# Patient Record
Sex: Female | Born: 1965 | Race: Black or African American | Hispanic: No | Marital: Married | Smoking: Never smoker
Health system: Southern US, Community
[De-identification: ages and names within clinical notes are randomized; demographics above are authoritative.]

## PROBLEM LIST (undated history)

## (undated) DIAGNOSIS — Z789 Other specified health status: Secondary | ICD-10-CM

## (undated) DIAGNOSIS — J069 Acute upper respiratory infection, unspecified: Secondary | ICD-10-CM

## (undated) DIAGNOSIS — T7840XA Allergy, unspecified, initial encounter: Secondary | ICD-10-CM

## (undated) HISTORY — PX: OTHER SURGICAL HISTORY: SHX169

## (undated) HISTORY — PX: ABDOMINAL HYSTERECTOMY: SHX81

## (undated) HISTORY — PX: WISDOM TOOTH EXTRACTION: SHX21

## (undated) HISTORY — DX: Allergy, unspecified, initial encounter: T78.40XA

---

## 1999-10-03 ENCOUNTER — Other Ambulatory Visit: Admission: RE | Admit: 1999-10-03 | Discharge: 1999-10-03 | Payer: Self-pay | Admitting: Obstetrics

## 2000-04-23 ENCOUNTER — Other Ambulatory Visit: Admission: RE | Admit: 2000-04-23 | Discharge: 2000-04-23 | Payer: Self-pay | Admitting: Obstetrics and Gynecology

## 2000-05-05 ENCOUNTER — Ambulatory Visit (HOSPITAL_COMMUNITY): Admission: RE | Admit: 2000-05-05 | Discharge: 2000-05-05 | Payer: Self-pay | Admitting: Obstetrics and Gynecology

## 2000-05-05 HISTORY — PX: TUBAL LIGATION: SHX77

## 2001-05-25 ENCOUNTER — Other Ambulatory Visit: Admission: RE | Admit: 2001-05-25 | Discharge: 2001-05-25 | Payer: Self-pay | Admitting: Obstetrics and Gynecology

## 2003-10-18 ENCOUNTER — Other Ambulatory Visit: Admission: RE | Admit: 2003-10-18 | Discharge: 2003-10-18 | Payer: Self-pay | Admitting: Obstetrics and Gynecology

## 2003-10-24 ENCOUNTER — Ambulatory Visit (HOSPITAL_COMMUNITY): Admission: RE | Admit: 2003-10-24 | Discharge: 2003-10-24 | Payer: Self-pay | Admitting: Obstetrics and Gynecology

## 2004-10-21 ENCOUNTER — Other Ambulatory Visit: Admission: RE | Admit: 2004-10-21 | Discharge: 2004-10-21 | Payer: Self-pay | Admitting: Obstetrics and Gynecology

## 2005-02-28 ENCOUNTER — Encounter (INDEPENDENT_AMBULATORY_CARE_PROVIDER_SITE_OTHER): Payer: Self-pay | Admitting: Specialist

## 2005-02-28 ENCOUNTER — Ambulatory Visit (HOSPITAL_COMMUNITY): Admission: RE | Admit: 2005-02-28 | Discharge: 2005-02-28 | Payer: Self-pay | Admitting: Obstetrics and Gynecology

## 2005-02-28 HISTORY — PX: DILATION AND CURETTAGE OF UTERUS: SHX78

## 2005-02-28 HISTORY — PX: NOVASURE ABLATION: SHX5394

## 2005-06-09 ENCOUNTER — Emergency Department (HOSPITAL_COMMUNITY): Admission: EM | Admit: 2005-06-09 | Discharge: 2005-06-09 | Payer: Self-pay | Admitting: Family Medicine

## 2005-06-17 ENCOUNTER — Emergency Department (HOSPITAL_COMMUNITY): Admission: EM | Admit: 2005-06-17 | Discharge: 2005-06-17 | Payer: Self-pay | Admitting: Emergency Medicine

## 2006-03-29 ENCOUNTER — Emergency Department (HOSPITAL_COMMUNITY): Admission: EM | Admit: 2006-03-29 | Discharge: 2006-03-29 | Payer: Self-pay | Admitting: Family Medicine

## 2010-01-07 ENCOUNTER — Other Ambulatory Visit: Admission: RE | Admit: 2010-01-07 | Discharge: 2010-01-07 | Payer: Self-pay | Admitting: Family Medicine

## 2010-08-09 NOTE — H&P (Signed)
[facility]  Patient:    Carol Giles, Carol Giles                    MRN: [id number] Adm. Date:  05/05/00 Attending:  Erie Noe P. Pennie Rushing, M.D.                         History and Physical  DATE OF BIRTH:                March 21, 1966  HISTORY OF PRESENT ILLNESS:   The patient is a 45 year old black female, para 2, 0, 0, 2, who presents for a surgical sterilization.  Her last menstrual period was approximately April 29, 2000.  Her periods are regular. Contraception is Norplant for the last 10 years.  (Had a new Norplant placed five years ago.)  PAST MENSTRUAL HISTORY:      Menarche at age 22, with monthly menses even with the Norplant in place.  The patient is currently sexually active.  GYN HISTORY:                  Negative.  PAST MEDICAL HISTORY:         Negative.  FAMILY HISTORY:               Positive for hypertension.  CURRENT MEDICATIONS:          None.  HABITS:                       Diet:  Regular.  Exercise:  Occasional.  ALLERGIES:                    PENICILLIN.  OBSTETRICAL HISTORY:          Two vaginal full-term deliveries.  REVIEW OF SYSTEMS:            Negative.  PHYSICAL EXAMINATION:  VITAL SIGNS:                  Blood pressure 120/80, weight 190 pounds, height 5 feet 2-3/4 inches.  HEENT:                        Within normal limits.  NECK/THYROID:                 Within normal limits.  CHEST:                        Within normal limits.  HEART:                        Within normal limits.  BREASTS:                      Within normal limits.  BACK:                         Within normal limits.  ABDOMEN:                      Within normal limits.  PELVIC:                       Within normal limits.  EXTREMITIES:                  Show six Norplant capsules in  the left forearm, without erythema, edema, or tenderness.  IMPRESSION:                   1. Desire for surgical sterilization.                               2.  Desires to have Norplant removed, as it                                  is past its due date.  DISPOSITION:                  A long discussion was held with the patient concerning options for management, and she wishes a surgical sterilization. She understands the risks of anesthesia, bleeding, infection, and damage to the adjacent organs, as well as the risks of failure of the tubal ligation with subsequent pregnancy.  She wishes to proceed.  She will have her Norplant removed at her postoperative visit.  Her tubal ligation will be performed at [facility] on May 05, 2000. DD:  05/04/00 TD:  05/05/00 Job: [id number]

## 2010-08-09 NOTE — Op Note (Signed)
NAMEHAZELGRACE, BONHAM            ACCOUNT NO.:  [id number]   MEDICAL RECORD NO.:  [id number]          PATIENT TYPE:  AMB   LOCATION:  SDC                           FACILITY:  [facility]   PHYSICIAN:  Janine Limbo, M.D.DATE OF BIRTH:  09/25/1965   DATE OF PROCEDURE:  02/28/2005  DATE OF DISCHARGE:                                 OPERATIVE REPORT   PREOPERATIVE DIAGNOSIS:  Menorrhagia.   POSTOPERATIVE DIAGNOSIS:  Menorrhagia.   PROCEDURE:  1.  Hysteroscopy.  2.  Dilatation and curettage.  3.  NovaSure endometrial ablation.   SURGEON:  Dr. Leonard Schwartz.   ANESTHETIC:  Was monitored anesthetic control and paracervical block.   DISPOSITION:  [name] is a 45 year old female who presents with  menorrhagia. She has not responded to hormonal therapy nor nonsteroidal anti-  inflammatory therapy. The patient understands the indications for her  surgical procedure and she accepts the risks of, but not limited to,  anesthetic complications, bleeding, infection, and possible damage to  surrounding organs.   FINDINGS:  The uterus was upper limits normal size. The endometrial cavity  sounded to 8.5 cm. The cervical length was 3.5 cm. No adnexal masses were  appreciated. On hysteroscopy we did not see any pathologic lesions within  the endometrial cavity. Pictures were taken.   PROCEDURE:  The patient was taken to the operating room where she was given  medication through her IV line. The patient's perineum, and vagina were  prepped with multiple layers of Betadine. A Foley catheter was placed in the  bladder. The patient was sterilely draped. Examination under anesthesia was  performed. A paracervical block was placed using 10 mL of half percent  Marcaine with epinephrine. An additional 10 mL of half percent Marcaine with  epinephrine were injected directly into the cervix. An endocervical  curettage was obtained. The uterus sounded to 8.5 cm. The cervix was  dilated. The  diagnostic hysteroscope was inserted and the cavity was  carefully inspected. No pathologic lesions were noted. Pictures were taken.  The hysteroscope was removed and the cavity was then curetted using a medium  sharp curette. The cavity was thought to be clean at the end of our  procedure. We then inserted the NovaSure ablation instrument. The cavity  length was noted to be 5.0 cm. The cavity width was noted to be 4.2 cm. The  power setting for device was placed at 116. A test was performed and the  cavity was noted to be intact. We then began the ablation procedure. The  total ablation time was 1 minute 40 seconds. The NovaSure device was removed  and the diagnostic hysteroscope was again inserted. The cavity was carefully  inspected. The cavity was noted to be adequately ablated. Once again a  picture was taken. All instruments were removed. Examination under  anesthesia was repeated and the uterus was noted to be firm. Hemostasis was  adequate. There was a fluid deficit of 110 mL of lactated Ringer's but it  should be noted that lactated Ringer's was noted on the operative drapes and  on the operative floor. Estimated blood loss  for the procedure was 10 mL.  The patient tolerated her procedure well. She was awakened from her  anesthetic without difficulty and taken to the recovery room in stable  condition. She tolerated her procedure well. The Foley catheter was removed.   FOLLOW-UP INSTRUCTIONS:  The patient was given a prescription for Vicodin  and she will take 1 or 2 tablets every 4 hours as needed for pain. She will  return to see Dr. Pennie Rushing in 2-3 weeks for follow-up examination. She will  call sooner for questions or concerns. She was given a copy of the  postoperative instruction sheet as prepared by the [facility] for patients who have undergone a dilatation and curettage.      Janine Limbo, M.D.  Electronically Signed     AVS/MEDQ  D:   02/28/2005  T:  02/28/2005  Job:  [id number]

## 2010-08-09 NOTE — Op Note (Signed)
[facility]  Patient:    Carol Giles, Carol Giles                   MRN: [id number] Proc. Date: 05/05/00 Adm. Date:  [id number] Attending:  Dierdre Forth Pearline                           Operative Report  PREOPERATIVE DIAGNOSIS:       Desire for surgical sterilization.  POSTOPERATIVE DIAGNOSES:      Desire for surgical sterilization and pelvic adhesions.  OPERATION:                    Laparoscopic tubal cautery.  SURGEON:                      Vanessa P. Pennie Rushing, M.D.  ANESTHESIA:                   General orotracheal.  ESTIMATED BLOOD LOSS:         Less than 10 cc.  COMPLICATIONS:                None.  FINDINGS:                     The uterus was essentially normal size. There was a veil of omental adhesions adherent to the bladder flap reflection and overlying the uterus. The tubes on the right side and left side had filmy adhesions which were primarily along the mid tube to the posterior uterus. The ovaries could not be well visualized secondary to adhesions.  DESCRIPTION OF PROCEDURE:     The patient was taken to the operating room after appropriate identification and placed on the operating table. After the attainment of adequate general anesthesia, she was placed in the modified lithotomy position. The abdomen, perineum, and vagina were prepped with multiple layers of Betadine and a red Robinson catheter used to empty the bladder. A single-tooth tenaculum was placed on the anterior cervix. The abdomen was draped as a sterile field. Five cc of 0.25% Marcaine was used to infiltrate the subumbilical area. A subumbilical incision was made and the Veress cannula placed through that incision into the peritoneal cavity. A pneumoperitoneum was created with 3 liters of CO2 and the Veress cannula removed. The laparoscopic trocar was placed through the incision into the peritoneal cavity. The laparoscope was placed through the trocar sleeve.  The cautery mechanism was placed through the operating channel of the laparoscope and the right fallopian tube identified, followed to its fimbriated end, the grasped at the isthmic portion and cauterized into adjacent areas. On the left side, the left fallopian tube was followed to its fimbriated end, grasped at the isthmic portion, and cauterized. An area slightly distal to the previous two cautery sites bled slightly on the tube and cautery was used to control that bleeding. Hemostasis was noted to be adequate. All instruments were removed from the peritoneal cavity under direct visualization and the CO2 was allowed to escape. The subumbilical incision was closed with a subcuticular suture of 3-0 Vicryl. A sterile dressing was applied. The single-tooth tenaculum was removed and the patient awakened from general anesthesia and taken to the recovery room in satisfactory condition having tolerated the procedure well with sponge and instrument counts correct.  DISCHARGE MEDICATIONS:        1. Ibuprofen 600 mg p.o. q.6h. p.r.n. pain.  2. Vicodin one p.o. q.6h. p.r.n. pain not                                  relieved by Ibuprofen.                               3. Doxycycline 100 mg p.o. b.i.d. for seven                                  days. DD:  05/05/00 TD:  05/05/00 Job: [id number]

## 2010-08-09 NOTE — H&P (Signed)
NAME:  Carol Giles, BARGA            ACCOUNT NO.:  [id number]   MEDICAL RECORD NO.:  [id number]          PATIENT TYPE:  AMB   LOCATION:  SDC                           FACILITY:  [facility]   PHYSICIAN:  Janine Limbo, M.D.DATE OF BIRTH:  May 31, 1965   DATE OF ADMISSION:  DATE OF DISCHARGE:                                HISTORY & PHYSICAL   HISTORY OF PRESENT ILLNESS:  Ms. Carol Giles is a 45 year old married African-  American female para 2-0-0-2 who is status post laparoscopic tubal cautery  and presents for endometrial ablation (NovaSure) procedure because of heavy  vaginal bleeding.  For the past several months the patient has had a five  day menstrual flow in which she changes a pad every 1 to 1.5 hours.  In  addition, the patient has on occasion soiled her clothing. She denies any  cramping, any vaginitis, any fever or dyspareunia.  The patient's pelvic  ultrasound in November 2006 was within normal limits, not showing any  ovarian cyst or uterine fibroids.  The patient's past thyroid panel has also  proven to be within normal limits.  The patient was given the option of  managing her symptoms with hormonal therapy, endometrial ablation, Mirena  IUD, hysterectomy, however, she has chosen to proceed with endometrial  ablation.   OBSTETRICAL HISTORY:  Gravida 2, para 2-0-0-2. GYN history menarche 45 years  old.  Patient's last menstrual period was February 21, 2005. She uses  laparoscopic tubal cautery as her method of contraception. She denies any  history of abnormal Pap smears or sexually transmitted diseases.   PAST MEDICAL HISTORY:  Negative.   PAST SURGICAL HISTORY:  2002 laparoscopic tubal cautery.  Denies any  problems with anesthesia or history of blood transfusions.   FAMILY HISTORY:  Positive for hypertension, thyroid disease, non-insulin-  dependent diabetes mellitus and stroke.   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   HABITS:  She does  not use alcohol or tobacco.   CURRENT MEDICATIONS:  None.   ALLERGIES:  PENICILLIN which causes hives.   REVIEW OF SYSTEMS:  The patient does wear contact lenses and except as  mentioned in  history of present illness, her review of systems is negative.   PHYSICAL EXAMINATION:  VITAL SIGNS:  Blood pressure 130/80. Height 5 feet 2-  1/2 inches tall.  GENERAL, NECK: Supple.  There are no masses, adenopathy or thyromegaly.  HEART:  Regular rate and rhythm.  There is no murmur.  LUNGS:  Clear to auscultation.  There are no wheezes, rales or rhonchi.  BACK:  No costovertebral angle tenderness.  ABDOMEN:  Bowel sounds present.  It is soft without tenderness, guarding,  rebound or organomegaly.  EXTREMITIES:  Are without cyanosis, clubbing or edema.  PELVIC:  EG/BUS are within normal limits.  Vagina is rugose. Cervix is  nontender without lesions. Uterus appears upper limits of normal size,  however, has normal shape and consistency and without tenderness.  Adnexa  have no tenderness or masses. Rectovaginal examination without tenderness or  masses.   IMPRESSION:  Menorrhagia.   DISPOSITION:  A  discussion was held with patient regarding the indications  for her procedure along with its' risks which include, but are not limited  to, reaction to anesthesia, damage to adjacent organs, infection and  excessive bleeding.  The patient was given a copy of the brochure on  NovaSure endometrial ablation.  She has consented to proceed with this  procedure at [facility] [place] on Friday, February 28, 2005 at  10:00 A.M.      [name].      Janine Limbo, M.D.  Electronically Signed    EJP/MEDQ  D:  02/25/2005  T:  02/25/2005  Job:  [id number]

## 2010-10-05 ENCOUNTER — Inpatient Hospital Stay (INDEPENDENT_AMBULATORY_CARE_PROVIDER_SITE_OTHER)
Admission: RE | Admit: 2010-10-05 | Discharge: 2010-10-05 | Disposition: A | Discharge status: Home / Self Care | Payer: 59 | Source: Ambulatory Visit | Attending: Family Medicine | Admitting: Family Medicine

## 2010-10-05 DIAGNOSIS — L03019 Cellulitis of unspecified finger: Secondary | ICD-10-CM

## 2011-02-05 ENCOUNTER — Other Ambulatory Visit: Payer: Self-pay | Admitting: Obstetrics & Gynecology

## 2011-02-15 ENCOUNTER — Encounter (HOSPITAL_COMMUNITY): Payer: Self-pay

## 2011-02-20 NOTE — Patient Instructions (Addendum)
   Your procedure is scheduled on: Monday, Dec 10th  Enter through the [employer] [facility] at: 6:00am Pick up the phone at the desk and dial [phone] and inform us of your arrival.  Please call this number if you have any problems the morning of surgery: [phone]  Remember: Do not eat food after midnight: Sunday Do not drink clear liquids after: Sunday Take these medicines the morning of surgery with a SIP OF WATER: None  Do not wear jewelry, make-up, or FINGER nail polish Do not wear lotions, powders, perfumes or deodorant. Do not shave 48 hours prior to surgery. Do not bring valuables to the hospital.  Patients discharged on the day of surgery will not be allowed to drive home.    Remember to use your hibiclens as instructed.Please shower with 1/2 bottle the evening before your surgery and the other 1/2 bottle the morning of surgery.

## 2011-02-21 ENCOUNTER — Encounter (HOSPITAL_COMMUNITY): Payer: Self-pay

## 2011-02-21 ENCOUNTER — Encounter (HOSPITAL_COMMUNITY)
Admission: RE | Admit: 2011-02-21 | Discharge: 2011-02-21 | Disposition: A | Discharge status: Home / Self Care | Payer: 59 | Source: Ambulatory Visit | Attending: Obstetrics & Gynecology | Admitting: Obstetrics & Gynecology

## 2011-02-21 HISTORY — DX: Acute upper respiratory infection, unspecified: J06.9

## 2011-02-21 LAB — CBC
MCV: 91.7 fL (ref 78.0–100.0)
Platelets: 258 10*3/uL (ref 150–400)
RBC: 4.11 MIL/uL (ref 3.87–5.11)
WBC: 4.2 10*3/uL (ref 4.0–10.5)

## 2011-02-21 NOTE — Pre-Procedure Instructions (Signed)
Ok to see patient DOS. 

## 2011-02-25 ENCOUNTER — Encounter (HOSPITAL_COMMUNITY): Payer: Self-pay | Admitting: Obstetrics & Gynecology

## 2011-02-25 DIAGNOSIS — N393 Stress incontinence (female) (male): Secondary | ICD-10-CM | POA: Diagnosis present

## 2011-02-25 NOTE — H&P (Signed)
  Chief Complaint: 45 y.o. who presents with stress urinary incontinence  Details of Present Illness: Long h/o urine with physical activity, cough or sneeze.  Exam in the office with a positive stress test.  Voiding diary reviewed.  There were no vitals taken for this visit.  Past Medical History  Diagnosis Date  . Recurrent upper respiratory infection (URI)     abx completed   History   Social History  . Marital Status: Married    Spouse Name: N/A    Number of Children: N/A  . Years of Education: N/A   Occupational History  . Not on file.   Social History Main Topics  . Smoking status: Never Smoker   . Smokeless tobacco: Never Used  . Alcohol Use: Yes     socially  . Drug Use: No  . Sexually Active: Yes    Birth Control/ Protection: Surgical   Other Topics Concern  . Not on file   Social History Narrative  . No narrative on file   History reviewed. No pertinent family history.  Pertinent items are noted in HPI.  Pre-Op Diagnosis: STRESS URINARY INCONTINENCE   Planned Procedure: Procedure(s): TRANSVAGINAL TAPE (TVT) PROCEDURE  I have reviewed the patient's history and have completed the physical exam and Carol Giles is acceptable for surgery.  Roseanna Rainbow, MD 02/25/2011 12:43 PM

## 2011-03-03 ENCOUNTER — Encounter (HOSPITAL_COMMUNITY): Payer: Self-pay | Admitting: Obstetrics & Gynecology

## 2011-03-03 ENCOUNTER — Ambulatory Visit (HOSPITAL_COMMUNITY): Payer: 59 | Admitting: Anesthesiology

## 2011-03-03 ENCOUNTER — Encounter (HOSPITAL_COMMUNITY)
Admission: RE | Disposition: A | Discharge status: Home / Self Care | Payer: Self-pay | Source: Ambulatory Visit | Attending: Obstetrics & Gynecology

## 2011-03-03 ENCOUNTER — Encounter (HOSPITAL_COMMUNITY): Payer: Self-pay | Admitting: Anesthesiology

## 2011-03-03 ENCOUNTER — Ambulatory Visit (HOSPITAL_COMMUNITY)
Admission: RE | Admit: 2011-03-03 | Discharge: 2011-03-03 | Disposition: A | Discharge status: Home / Self Care | Payer: 59 | Source: Ambulatory Visit | Attending: Obstetrics & Gynecology | Admitting: Obstetrics & Gynecology

## 2011-03-03 DIAGNOSIS — Z01812 Encounter for preprocedural laboratory examination: Secondary | ICD-10-CM | POA: Insufficient documentation

## 2011-03-03 DIAGNOSIS — N393 Stress incontinence (female) (male): Secondary | ICD-10-CM | POA: Diagnosis present

## 2011-03-03 DIAGNOSIS — Z01818 Encounter for other preprocedural examination: Secondary | ICD-10-CM | POA: Insufficient documentation

## 2011-03-03 HISTORY — PX: BLADDER SUSPENSION: SHX72

## 2011-03-03 HISTORY — PX: CYSTOSCOPY: SHX5120

## 2011-03-03 SURGERY — URETHROPEXY, USING TRANSVAGINAL TAPE
Anesthesia: General | Site: Vagina | Laterality: Bilateral | Wound class: Clean Contaminated

## 2011-03-03 MED ORDER — NEOSTIGMINE METHYLSULFATE 1 MG/ML IJ SOLN
INTRAMUSCULAR | Status: AC
  Filled 2011-03-03: qty 10

## 2011-03-03 MED ORDER — ACETAMINOPHEN 325 MG PO TABS: 325.0000 mg | ORAL_TABLET | ORAL | Status: DC | PRN

## 2011-03-03 MED ORDER — MIDAZOLAM HCL 5 MG/5ML IJ SOLN
INTRAMUSCULAR | Status: DC | PRN
  Administered 2011-03-03: 2 mg via INTRAVENOUS

## 2011-03-03 MED ORDER — PROMETHAZINE HCL 25 MG/ML IJ SOLN: 6.2500 mg | INTRAMUSCULAR | Status: DC | PRN

## 2011-03-03 MED ORDER — FENTANYL CITRATE 0.05 MG/ML IJ SOLN
INTRAMUSCULAR | Status: AC
  Filled 2011-03-03: qty 2

## 2011-03-03 MED ORDER — KETOROLAC TROMETHAMINE 30 MG/ML IJ SOLN
INTRAMUSCULAR | Status: DC | PRN
  Administered 2011-03-03: 30 mg via INTRAVENOUS

## 2011-03-03 MED ORDER — NEOSTIGMINE METHYLSULFATE 1 MG/ML IJ SOLN
INTRAMUSCULAR | Status: DC | PRN
  Administered 2011-03-03: 3 mg via INTRAVENOUS

## 2011-03-03 MED ORDER — FENTANYL CITRATE 0.05 MG/ML IJ SOLN: 25.0000 ug | INTRAMUSCULAR | Status: DC | PRN

## 2011-03-03 MED ORDER — MIDAZOLAM HCL 2 MG/2ML IJ SOLN
INTRAMUSCULAR | Status: AC
  Filled 2011-03-03: qty 2

## 2011-03-03 MED ORDER — ROCURONIUM BROMIDE 100 MG/10ML IV SOLN
INTRAVENOUS | Status: DC | PRN
  Administered 2011-03-03: 50 mg via INTRAVENOUS

## 2011-03-03 MED ORDER — KETOROLAC TROMETHAMINE 30 MG/ML IJ SOLN
INTRAMUSCULAR | Status: AC
  Filled 2011-03-03: qty 1

## 2011-03-03 MED ORDER — DEXAMETHASONE SODIUM PHOSPHATE 10 MG/ML IJ SOLN
INTRAMUSCULAR | Status: DC | PRN
  Administered 2011-03-03: 10 mg via INTRAVENOUS

## 2011-03-03 MED ORDER — LIDOCAINE-EPINEPHRINE 0.5-1:200000 % IJ SOLN
INTRAMUSCULAR | Status: DC | PRN
  Administered 2011-03-03: 7 mL

## 2011-03-03 MED ORDER — FENTANYL CITRATE 0.05 MG/ML IJ SOLN
INTRAMUSCULAR | Status: DC | PRN
  Administered 2011-03-03 (×3): 50 ug via INTRAVENOUS

## 2011-03-03 MED ORDER — STERILE WATER FOR IRRIGATION IR SOLN
Status: DC | PRN
  Administered 2011-03-03: 1000 mL via INTRAVESICAL

## 2011-03-03 MED ORDER — LIDOCAINE HCL (CARDIAC) 20 MG/ML IV SOLN
INTRAVENOUS | Status: DC | PRN
  Administered 2011-03-03: 100 mg via INTRAVENOUS

## 2011-03-03 MED ORDER — KETOROLAC TROMETHAMINE 30 MG/ML IJ SOLN: 15.0000 mg | Freq: Once | INTRAMUSCULAR | Status: DC | PRN

## 2011-03-03 MED ORDER — DEXAMETHASONE SODIUM PHOSPHATE 10 MG/ML IJ SOLN
INTRAMUSCULAR | Status: AC
  Filled 2011-03-03: qty 1

## 2011-03-03 MED ORDER — CLINDAMYCIN PHOSPHATE 900 MG/50ML IV SOLN
900.0000 mg | Freq: Once | INTRAVENOUS | Status: AC
  Administered 2011-03-03: 900 mg via INTRAVENOUS
  Filled 2011-03-03: qty 50

## 2011-03-03 MED ORDER — OXYCODONE-ACETAMINOPHEN 5-325 MG PO TABS: 2.0000 | ORAL_TABLET | Freq: Four times a day (QID) | ORAL | Status: AC | PRN

## 2011-03-03 MED ORDER — GLYCOPYRROLATE 0.2 MG/ML IJ SOLN
INTRAMUSCULAR | Status: AC
  Filled 2011-03-03: qty 1

## 2011-03-03 MED ORDER — LACTATED RINGERS IV SOLN
INTRAVENOUS | Status: DC
  Administered 2011-03-03: 1000 mL via INTRAVENOUS
  Administered 2011-03-03: 08:00:00 via INTRAVENOUS

## 2011-03-03 MED ORDER — GLYCOPYRROLATE 0.2 MG/ML IJ SOLN
INTRAMUSCULAR | Status: DC | PRN
  Administered 2011-03-03: 0.2 mg via INTRAVENOUS
  Administered 2011-03-03: .4 mg via INTRAVENOUS

## 2011-03-03 MED ORDER — ONDANSETRON HCL 4 MG/2ML IJ SOLN
INTRAMUSCULAR | Status: DC | PRN
  Administered 2011-03-03: 4 mg via INTRAVENOUS

## 2011-03-03 MED ORDER — LIDOCAINE HCL (CARDIAC) 20 MG/ML IV SOLN
INTRAVENOUS | Status: AC
  Filled 2011-03-03: qty 5

## 2011-03-03 MED ORDER — ROCURONIUM BROMIDE 50 MG/5ML IV SOLN
INTRAVENOUS | Status: AC
  Filled 2011-03-03: qty 1

## 2011-03-03 MED ORDER — LEVOFLOXACIN 500 MG PO TABS: 500.0000 mg | ORAL_TABLET | Freq: Every day | ORAL | Status: AC

## 2011-03-03 MED ORDER — ONDANSETRON HCL 4 MG/2ML IJ SOLN
INTRAMUSCULAR | Status: AC
  Filled 2011-03-03: qty 2

## 2011-03-03 MED ORDER — PROPOFOL 10 MG/ML IV EMUL
INTRAVENOUS | Status: DC | PRN
  Administered 2011-03-03: 200 mg via INTRAVENOUS

## 2011-03-03 SURGICAL SUPPLY — 26 items
ADH SKN CLS APL DERMABOND .7 (GAUZE/BANDAGES/DRESSINGS) ×2
BLADE SURG 15 STRL LF C SS BP (BLADE) ×2 IMPLANT
BLADE SURG 15 STRL SS (BLADE) ×3
BLADE SURG CLIPPER 3M 9600 (MISCELLANEOUS) ×1 IMPLANT
CANISTER SUCTION 2500CC (MISCELLANEOUS) ×3 IMPLANT
CATH ROBINSON RED A/P 16FR (CATHETERS) IMPLANT
CLOTH BEACON ORANGE TIMEOUT ST (SAFETY) ×3 IMPLANT
DECANTER SPIKE VIAL GLASS SM (MISCELLANEOUS) ×1 IMPLANT
DERMABOND ADVANCED (GAUZE/BANDAGES/DRESSINGS) ×1
DERMABOND ADVANCED .7 DNX12 (GAUZE/BANDAGES/DRESSINGS) IMPLANT
DRAPE HYSTEROSCOPY (DRAPE) ×3 IMPLANT
GAUZE PACKING 2X5 YD STERILE (GAUZE/BANDAGES/DRESSINGS) IMPLANT
GLOVE BIO SURGEON STRL SZ 6.5 (GLOVE) ×6 IMPLANT
GOWN PREVENTION PLUS LG XLONG (DISPOSABLE) ×6 IMPLANT
NEEDLE HYPO 22GX1.5 SAFETY (NEEDLE) ×3 IMPLANT
NS IRRIG 1000ML POUR BTL (IV SOLUTION) ×3 IMPLANT
PACK VAGINAL WOMENS (CUSTOM PROCEDURE TRAY) ×3 IMPLANT
PEN SKIN MARKING STD PT W/RULR (MISCELLANEOUS) ×1 IMPLANT
SET CYSTO W/LG BORE CLAMP LF (SET/KITS/TRAYS/PACK) ×3 IMPLANT
SUT CHROMIC 2 0 SH (SUTURE) ×1 IMPLANT
SUT VIC AB 2-0 SH 27 (SUTURE) ×3
SUT VIC AB 2-0 SH 27XBRD (SUTURE) IMPLANT
TOWEL OR 17X24 6PK STRL BLUE (TOWEL DISPOSABLE) ×6 IMPLANT
TRAY FOLEY CATH 14FR (SET/KITS/TRAYS/PACK) ×3 IMPLANT
TVT Abbrevio ×1 IMPLANT
WATER STERILE IRR 1000ML POUR (IV SOLUTION) ×3 IMPLANT

## 2011-03-03 NOTE — Interval H&P Note (Signed)
History and Physical Interval Note:  03/03/2011 7:19 AM  Carol Giles  has presented today for surgery, with the diagnosis of STRESS URINARY INCONTINENCE  The various methods of treatment have been discussed with the patient and family. After consideration of risks, benefits and other options for treatment, the patient has consented to  Procedure(s): TRANSVAGINAL TAPE (TVT) PROCEDURE as a surgical intervention .  The patients' history has been reviewed, patient examined, no change in status, stable for surgery.  I have reviewed the patients' chart and labs.  Questions were answered to the patient's satisfaction.     Arenson-MOORE,Carroll Lingelbach A

## 2011-03-03 NOTE — OR Nursing (Signed)
TVT Abbrevio implanted@ midurethral position in OR suite per Dr. Tamela Oddi. Sales Rep and Urology MD proctoring. Lot # [id number]. Exp. 09-22-11.

## 2011-03-03 NOTE — Brief Op Note (Signed)
03/03/2011  8:30 AM  PATIENT:  Carol Giles  46 y.o. female  PRE-OPERATIVE DIAGNOSIS:  STRESS URINARY INCONTINENCE  POST-OPERATIVE DIAGNOSIS:  Stress Urinary Incontinence  PROCEDURE:  Procedure(s): TRANSVAGINAL TAPE (TVT) PROCEDURE CYSTOSCOPY  SURGEON:  Surgeon(s): Roseanna Rainbow, MD Brock Bad, MD    ANESTHESIA:   general  EBL:  Total I/O In: 1000 [I.V.:1000] Out: -   BLOOD ADMINISTERED:none  DRAINS: none   LOCAL MEDICATIONS USED:  LIDOCAINE 10 CC COUNTS:  YES   PLAN OF CARE: Discharge to home after PACU  PATIENT DISPOSITION:  PACU - hemodynamically stable.   Delay start of Pharmacological VTE agent (>24hrs) due to surgical blood loss or risk of bleeding:  {YES/NO/NOT APPLICABLE:20182

## 2011-03-03 NOTE — Anesthesia Preprocedure Evaluation (Signed)
Anesthesia Evaluation  Patient identified by MRN, date of birth, ID band Patient awake    Reviewed: Allergy & Precautions, H&P , Patient's Chart, lab work & pertinent test results, reviewed documented beta blocker date and time   History of Anesthesia Complications Negative for: history of anesthetic complications  Airway Mallampati: II TM Distance: >3 FB Neck ROM: full    Dental No notable dental hx.    Pulmonary neg pulmonary ROS, Recent URI , Resolved,  clear to auscultation  Pulmonary exam normal       Cardiovascular Exercise Tolerance: Good neg cardio ROS regular Normal    Neuro/Psych Negative Neurological ROS  Negative Psych ROS   GI/Hepatic negative GI ROS, Neg liver ROS,   Endo/Other  Negative Endocrine ROS  Renal/GU negative Renal ROS     Musculoskeletal   Abdominal   Peds  Hematology negative hematology ROS (+)   Anesthesia Other Findings   Reproductive/Obstetrics negative OB ROS                           Anesthesia Physical Anesthesia Plan  ASA: I  Anesthesia Plan: General LMA   Post-op Pain Management:    Induction:   Airway Management Planned:   Additional Equipment:   Intra-op Plan:   Post-operative Plan:   Informed Consent: I have reviewed the patients History and Physical, chart, labs and discussed the procedure including the risks, benefits and alternatives for the proposed anesthesia with the patient or authorized representative who has indicated his/her understanding and acceptance.   Dental Advisory Given  Plan Discussed with: CRNA and Surgeon  Anesthesia Plan Comments:        please clear LMA as airway with surgeon Anesthesia Quick Evaluation

## 2011-03-03 NOTE — Transfer of Care (Signed)
Immediate Anesthesia Transfer of Care Note  Patient: Carol Giles  Procedure(s) Performed:  TRANSVAGINAL TAPE (TVT) PROCEDURE - transobturator mid-urethral sling placement ; CYSTOSCOPY  Patient Location: PACU  Anesthesia Type: General  Level of Consciousness: awake and alert   Airway & Oxygen Therapy: Patient Spontanous Breathing and Patient connected to nasal cannula oxygen  Post-op Assessment: Report given to PACU RN  Post vital signs: Reviewed and stable  Complications: No apparent anesthesia complications

## 2011-03-03 NOTE — Anesthesia Procedure Notes (Signed)
Procedure Name: Intubation Date/Time: 03/03/2011 7:44 AM Performed by: Trea Carnegie MARIE Pre-anesthesia Checklist: Patient identified, Patient being monitored, Emergency Drugs available, Timeout performed and Suction available Patient Re-evaluated:Patient Re-evaluated prior to inductionOxygen Delivery Method: Circle System Utilized Preoxygenation: Pre-oxygenation with 100% oxygen Intubation Type: IV induction Ventilation: Mask ventilation without difficulty Laryngoscope Size: Mac and 3 Grade View: Grade I Number of attempts: 2 Airway Equipment and Method: video-laryngoscopy

## 2011-03-03 NOTE — Op Note (Signed)
Placement of Gynecare TVT Anhar Mcdermott female 45 y.o. 03/03/2011  Procedure(s) and Anesthesia Type:    * TRANSVAGINAL TAPE (TVT) PROCEDURE - General    * CYSTOSCOPY - General  Surgeon(s) and Role:    * Roseanna Rainbow, MD - Primary    * Brock Bad, MD - Assisting   Indications: The patient had symptoms and findings consistent with uncomplicated genuine stress urinary incontinence.      Surgeon: Antionette Char A   Anesthesia: General endotracheal anesthesia   Procedure Detail  After a time-out had been completed, an 18 french Foley catheter was placed.  The bladder was emptied and the catheter clamped.  The areas for the groin incisions were located inferior to the adductus longus muscle insertions and marked.  The vaginal mucosa was infiltrated with 1%lidocaine with epinephrine.  A 1 - 2 cm, full thickness incision was made under the urethra.  The vaginal epithelium was dissected free to the pubic ramus.  This was performed bilaterally.  Starting on the patient's right side, the winged guide was placed in the dissected space.  The helical passer was placed over the winged guide.  The obturator membrane was perforated.  The helical passer was passed toward the marked groin incision.  The procedure was repeated on the opposite side.   The position lines were adjusted by placing a right angle instrument between the mesh and the urethra.  The mesh was released.  The placement loop was cut.  The vaginal incision was closed with a running suture of 2- 0 vicryl.  Dermabond was applied to the groin incisions.  The bladder was emptied and clear urine was noted.  The Foley catheter was removed.  A cystoscopy was performed.  The ureteral orifices were visualized.   At the close of the procedure, the instrument and pack counts were correct x 2.  The patient was awakened from anesthesia and taken to the PACU in stable condition  TRANSVAGINAL TAPE (TVT) PROCEDURE,  CYSTOSCOPY  Findings: See above  Estimated Blood Loss:  Minimal               Total IV Fluids: per Anesthesiology          Specimens: N/A         Implants: see above        Complications:  None         Disposition: PACU - hemodynamically stable.         Condition: stable

## 2011-03-03 NOTE — Transfer of Care (Signed)
Immediate Anesthesia Transfer of Care Note  Patient: Carol Giles  Procedure(s) Performed:  TRANSVAGINAL TAPE (TVT) PROCEDURE - transobturator mid-urethral sling placement ; CYSTOSCOPY  Patient Location: PACU  Anesthesia Type: General  Level of Consciousness: awake and sedated  Airway & Oxygen Therapy: Patient Spontanous Breathing  Post-op Assessment: Report given to PACU RN  Post vital signs: Reviewed and stable  Complications: No apparent anesthesia complications

## 2011-03-03 NOTE — Transfer of Care (Signed)
Immediate Anesthesia Transfer of Care Note  Patient: Carol Giles  Procedure(s) Performed:  TRANSVAGINAL TAPE (TVT) PROCEDURE - transobturator mid-urethral sling placement ; CYSTOSCOPY  Patient Location: PACU  Anesthesia Type: General  Level of Consciousness: awake, alert  and oriented  Airway & Oxygen Therapy: Patient Spontanous Breathing and Patient connected to nasal cannula oxygen  Post-op Assessment: Report given to PACU RN  Post vital signs: Reviewed and stable  Complications: No apparent anesthesia complications

## 2011-03-05 ENCOUNTER — Encounter (HOSPITAL_COMMUNITY): Payer: Self-pay | Admitting: Obstetrics & Gynecology

## 2011-03-05 NOTE — Anesthesia Postprocedure Evaluation (Signed)
  Anesthesia Post-op Note  Patient: [name]  Procedure(s) Performed:  TRANSVAGINAL TAPE (TVT) PROCEDURE - transobturator mid-urethral sling placement ; CYSTOSCOPY  Patient is awake and responsive. Pain and nausea are reasonably well controlled. Vital signs are stable and clinically acceptable. Oxygen saturation is clinically acceptable. There are no apparent anesthetic complications at this time. Patient is ready for discharge.

## 2011-09-05 ENCOUNTER — Other Ambulatory Visit: Payer: Self-pay | Admitting: Obstetrics & Gynecology

## 2011-09-05 DIAGNOSIS — Z1231 Encounter for screening mammogram for malignant neoplasm of breast: Secondary | ICD-10-CM

## 2011-09-10 ENCOUNTER — Other Ambulatory Visit: Payer: Self-pay | Admitting: Obstetrics & Gynecology

## 2011-09-10 DIAGNOSIS — N926 Irregular menstruation, unspecified: Secondary | ICD-10-CM

## 2011-09-16 ENCOUNTER — Ambulatory Visit (HOSPITAL_COMMUNITY)
Admission: RE | Admit: 2011-09-16 | Discharge: 2011-09-16 | Disposition: A | Discharge status: Home / Self Care | Payer: 59 | Source: Ambulatory Visit | Attending: Obstetrics & Gynecology | Admitting: Obstetrics & Gynecology

## 2011-09-16 DIAGNOSIS — N838 Other noninflammatory disorders of ovary, fallopian tube and broad ligament: Secondary | ICD-10-CM | POA: Insufficient documentation

## 2011-09-16 DIAGNOSIS — N949 Unspecified condition associated with female genital organs and menstrual cycle: Secondary | ICD-10-CM | POA: Insufficient documentation

## 2011-09-16 DIAGNOSIS — N938 Other specified abnormal uterine and vaginal bleeding: Secondary | ICD-10-CM | POA: Insufficient documentation

## 2011-09-16 DIAGNOSIS — N926 Irregular menstruation, unspecified: Secondary | ICD-10-CM

## 2011-10-01 ENCOUNTER — Ambulatory Visit (HOSPITAL_COMMUNITY)
Admission: RE | Admit: 2011-10-01 | Discharge: 2011-10-01 | Disposition: A | Discharge status: Home / Self Care | Payer: 59 | Source: Ambulatory Visit | Attending: Obstetrics & Gynecology | Admitting: Obstetrics & Gynecology

## 2011-10-01 DIAGNOSIS — Z1231 Encounter for screening mammogram for malignant neoplasm of breast: Secondary | ICD-10-CM | POA: Insufficient documentation

## 2012-01-28 ENCOUNTER — Other Ambulatory Visit: Payer: Self-pay | Admitting: Obstetrics & Gynecology

## 2012-03-01 ENCOUNTER — Encounter (HOSPITAL_COMMUNITY): Payer: Self-pay | Admitting: Pharmacy Technician

## 2012-03-03 ENCOUNTER — Encounter (HOSPITAL_COMMUNITY): Payer: Self-pay

## 2012-03-03 ENCOUNTER — Encounter (HOSPITAL_COMMUNITY)
Admission: RE | Admit: 2012-03-03 | Discharge: 2012-03-03 | Disposition: A | Discharge status: Home / Self Care | Payer: 59 | Source: Ambulatory Visit | Attending: Obstetrics & Gynecology | Admitting: Obstetrics & Gynecology

## 2012-03-03 HISTORY — DX: Other specified health status: Z78.9

## 2012-03-03 NOTE — Progress Notes (Signed)
CBC and pregnancy serum test results Dr. Tamela Oddi 02/26/12 on chart. Other labs will be completed on day of surgery per electronic order

## 2012-03-03 NOTE — Patient Instructions (Signed)
20 Carol Giles  03/03/2012   Your procedure is scheduled on: 03/05/12  Report to [facility] at [phone].  Call this number if you have problems the morning of surgery [phone]   Remember:   Do not eat food or drink liquids After Midnight.     Take these medicines the morning of surgery with A SIP OF WATER: claritin   Do not wear jewelry, make-up or nail polish.  Do not wear lotions, powders, or perfumes. You may wear deodorant.  Do not shave 48 hours prior to surgery. Men may shave face and neck.  Do not bring valuables to the hospital.  Contacts, dentures or bridgework may not be worn into surgery.  Leave suitcase in the car. After surgery it may be brought to your room.  For patients admitted to the hospital, checkout time is 11:00 AM the day of discharge.     Special Instructions: Shower using CHG 2 nights before surgery and the night before surgery.  If you shower the day of surgery use CHG.  Use special wash - you have one bottle of CHG for all showers.  You should use approximately 1/3 of the bottle for each shower.   Please read over the following fact sheets that you were given: MRSA Information, blood fact sheet  Birdie Sons, RN  pre op nurse call if needed [phone]    FAILURE TO FOLLOW THESE INSTRUCTIONS MAY RESULT IN CANCELLATION OF YOUR SURGERY   Patient Signature: ___________________________________________

## 2012-03-03 NOTE — H&P (Signed)
  Subjective:  Carol Giles is a 46 y.o. female with abnormal uterine bleeding.  Onset of symptoms was gradual starting several months ago with unchanged course since that time. Bleeding is characterized as moderate.  Evaluation to date: pelvic ultrasound: negative except for blood in the endometrial cavity; unilateral, unilocular ovarian cyst.  There is a remote h/o an endometrial ablation. Pertinent Gyn History:  Menses flow is moderate Bleeding: irregular Contraception: tubal ligation Preventive screening:  Last mammogram: normal Date: 2010 Last pap: normal Date: 2011  Patient Active Problem List   Diagnosis Date Noted  . SUI (stress urinary incontinence, female) 02/25/2011   Past Medical History  Diagnosis Date  . Recurrent upper respiratory infection (URI)     abx completed  . No pertinent past medical history     Past Surgical History  Procedure Date  . Tubal ligation 05/05/00  . Dilation and curettage of uterus 02/28/05  . Novasure ablation 02/28/05  . Wisdom tooth extraction   . Svd     x 2  . Bladder suspension 03/03/2011    Procedure: TRANSVAGINAL TAPE (TVT) PROCEDURE;  Surgeon: Roseanna Rainbow, MD;  Location: [facility];  Service: Gynecology;  Laterality: Bilateral;  transobturator mid-urethral sling placement   . Cystoscopy 03/03/2011    Procedure: CYSTOSCOPY;  Surgeon: Roseanna Rainbow, MD;  Location: [facility];  Service: Gynecology;;    No prescriptions prior to admission   Allergies  Allergen Reactions  . Penicillins Hives    History  Substance Use Topics  . Smoking status: Never Smoker   . Smokeless tobacco: Never Used  . Alcohol Use: No    FH: HTN  Review of Systems Pertinent items are noted in HPI.    Objective:   Vital signs in last 24 hours: Temp:  [97.8 F (36.6 C)] 97.8 F (36.6 C) (12/11 0830) Pulse Rate:  [81] 81  (12/11 0830) Resp:  [16] 16  (12/11 0830) BP: (143)/(90) 143/90 mmHg (12/11 0830) SpO2:  [100 %] 100 % (12/11  0830) Weight:  [81.829 kg (180 lb 6.4 oz)] 81.829 kg (180 lb 6.4 oz) (12/11 0830)  General:   alert  Skin:   normal  HEENT:  PERRLA  Lungs:   clear to auscultation bilaterally  Heart:   regular rate and rhythm, S1, S2 normal, no murmur, click, rub or gallop  Breasts:   normal without suspicious masses, skin or nipple changes or axillary nodes  Abdomen:  soft, non-tender; bowel sounds normal; no masses,  no organomegaly  Pelvis:  Vulva and vagina appear normal. Bimanual exam reveals normal uterus and adnexa.                                     Assessment/Plan: Abnormal bleeding--O, E.  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  Procedure, risks, reasons, benefits and complications (including injury to bowel, bladder, major blood vessel, ureter, bleeding, possibility of transfusion, infection, or fistula formation) were reviewed in detail. Consent was signed and preop testing was ordered.  Instructions were reviewed, including NPO after midnight.

## 2012-03-05 ENCOUNTER — Encounter (HOSPITAL_COMMUNITY): Payer: Self-pay | Admitting: *Deleted

## 2012-03-05 ENCOUNTER — Encounter (HOSPITAL_COMMUNITY)
Admission: RE | Disposition: A | Discharge status: Home / Self Care | Payer: Self-pay | Source: Ambulatory Visit | Attending: Obstetrics & Gynecology

## 2012-03-05 ENCOUNTER — Ambulatory Visit (HOSPITAL_COMMUNITY)
Admission: RE | Admit: 2012-03-05 | Discharge: 2012-03-06 | Disposition: A | Discharge status: Home / Self Care | Payer: 59 | Source: Ambulatory Visit | Attending: Obstetrics & Gynecology | Admitting: Obstetrics & Gynecology

## 2012-03-05 ENCOUNTER — Ambulatory Visit (HOSPITAL_COMMUNITY): Payer: 59 | Admitting: Anesthesiology

## 2012-03-05 ENCOUNTER — Encounter (HOSPITAL_COMMUNITY): Payer: Self-pay | Admitting: Anesthesiology

## 2012-03-05 DIAGNOSIS — K660 Peritoneal adhesions (postprocedural) (postinfection): Secondary | ICD-10-CM | POA: Insufficient documentation

## 2012-03-05 DIAGNOSIS — N949 Unspecified condition associated with female genital organs and menstrual cycle: Secondary | ICD-10-CM | POA: Insufficient documentation

## 2012-03-05 DIAGNOSIS — N926 Irregular menstruation, unspecified: Secondary | ICD-10-CM | POA: Diagnosis present

## 2012-03-05 DIAGNOSIS — N7013 Chronic salpingitis and oophoritis: Secondary | ICD-10-CM | POA: Insufficient documentation

## 2012-03-05 DIAGNOSIS — Z01812 Encounter for preprocedural laboratory examination: Secondary | ICD-10-CM | POA: Insufficient documentation

## 2012-03-05 DIAGNOSIS — N938 Other specified abnormal uterine and vaginal bleeding: Secondary | ICD-10-CM | POA: Insufficient documentation

## 2012-03-05 HISTORY — PX: BILATERAL SALPINGECTOMY: SHX5743

## 2012-03-05 HISTORY — PX: ROBOTIC ASSISTED TOTAL HYSTERECTOMY: SHX6085

## 2012-03-05 LAB — TYPE AND SCREEN
ABO/RH(D): O POS
Antibody Screen: NEGATIVE

## 2012-03-05 SURGERY — ROBOTIC ASSISTED TOTAL HYSTERECTOMY
Anesthesia: General

## 2012-03-05 MED ORDER — ACETAMINOPHEN 10 MG/ML IV SOLN
INTRAVENOUS | Status: AC
  Filled 2012-03-05: qty 100

## 2012-03-05 MED ORDER — PROMETHAZINE HCL 25 MG/ML IJ SOLN: 6.2500 mg | INTRAMUSCULAR | Status: DC | PRN

## 2012-03-05 MED ORDER — OXYCODONE-ACETAMINOPHEN 5-325 MG PO TABS
1.0000 | ORAL_TABLET | ORAL | Status: DC | PRN
  Administered 2012-03-05: 2 via ORAL
  Administered 2012-03-06: 1 via ORAL
  Filled 2012-03-05: qty 2
  Filled 2012-03-05: qty 1

## 2012-03-05 MED ORDER — CLINDAMYCIN PHOSPHATE 900 MG/50ML IV SOLN: 900.0000 mg | INTRAVENOUS | Status: DC

## 2012-03-05 MED ORDER — CLINDAMYCIN PHOSPHATE 900 MG/50ML IV SOLN
INTRAVENOUS | Status: DC | PRN
  Administered 2012-03-05: 900 mg via INTRAVENOUS

## 2012-03-05 MED ORDER — DEXAMETHASONE SODIUM PHOSPHATE 4 MG/ML IJ SOLN
INTRAMUSCULAR | Status: DC | PRN
  Administered 2012-03-05: 10 mg via INTRAVENOUS

## 2012-03-05 MED ORDER — CLINDAMYCIN PHOSPHATE 900 MG/50ML IV SOLN
INTRAVENOUS | Status: AC
  Filled 2012-03-05: qty 50

## 2012-03-05 MED ORDER — ONDANSETRON HCL 4 MG PO TABS: 4.0000 mg | ORAL_TABLET | Freq: Four times a day (QID) | ORAL | Status: DC | PRN

## 2012-03-05 MED ORDER — STERILE WATER FOR IRRIGATION IR SOLN
Status: DC | PRN
  Administered 2012-03-05: 3000 mL

## 2012-03-05 MED ORDER — FENTANYL CITRATE 0.05 MG/ML IJ SOLN
INTRAMUSCULAR | Status: DC | PRN
  Administered 2012-03-05: 50 ug via INTRAVENOUS
  Administered 2012-03-05: 75 ug via INTRAVENOUS
  Administered 2012-03-05: 25 ug via INTRAVENOUS
  Administered 2012-03-05 (×2): 50 ug via INTRAVENOUS

## 2012-03-05 MED ORDER — BUPIVACAINE HCL (PF) 0.25 % IJ SOLN
INTRAMUSCULAR | Status: AC
  Filled 2012-03-05: qty 30

## 2012-03-05 MED ORDER — GLYCOPYRROLATE 0.2 MG/ML IJ SOLN
INTRAMUSCULAR | Status: DC | PRN
  Administered 2012-03-05: 0.4 mg via INTRAVENOUS

## 2012-03-05 MED ORDER — LACTATED RINGERS IR SOLN
Status: DC | PRN
  Administered 2012-03-05: 1000 mL

## 2012-03-05 MED ORDER — ACETAMINOPHEN 10 MG/ML IV SOLN
INTRAVENOUS | Status: DC | PRN
  Administered 2012-03-05: 1000 mg via INTRAVENOUS

## 2012-03-05 MED ORDER — MIDAZOLAM HCL 5 MG/5ML IJ SOLN
INTRAMUSCULAR | Status: DC | PRN
  Administered 2012-03-05: 1 mg via INTRAVENOUS

## 2012-03-05 MED ORDER — SUCCINYLCHOLINE CHLORIDE 20 MG/ML IJ SOLN
INTRAMUSCULAR | Status: DC | PRN
  Administered 2012-03-05: 140 mg via INTRAVENOUS

## 2012-03-05 MED ORDER — LACTATED RINGERS IV SOLN
INTRAVENOUS | Status: DC
  Administered 2012-03-05: 1000 mL via INTRAVENOUS

## 2012-03-05 MED ORDER — GENTAMICIN SULFATE 40 MG/ML IJ SOLN
INTRAVENOUS | Status: DC | PRN
  Administered 2012-03-05: 400 mL via INTRAVENOUS

## 2012-03-05 MED ORDER — LIDOCAINE HCL (CARDIAC) 20 MG/ML IV SOLN
INTRAVENOUS | Status: DC | PRN
  Administered 2012-03-05: 30 mg via INTRAVENOUS

## 2012-03-05 MED ORDER — HYDROMORPHONE HCL PF 1 MG/ML IJ SOLN
INTRAMUSCULAR | Status: DC | PRN
  Administered 2012-03-05 (×4): 0.5 mg via INTRAVENOUS

## 2012-03-05 MED ORDER — DEXTROSE 5 % IV SOLN
400.0000 mg | INTRAVENOUS | Status: DC
  Filled 2012-03-05 (×2): qty 10

## 2012-03-05 MED ORDER — BUPIVACAINE HCL (PF) 0.25 % IJ SOLN
INTRAMUSCULAR | Status: DC | PRN
  Administered 2012-03-05: 10 mL

## 2012-03-05 MED ORDER — LACTATED RINGERS IV SOLN
INTRAVENOUS | Status: DC | PRN
  Administered 2012-03-05 (×3): via INTRAVENOUS

## 2012-03-05 MED ORDER — KETOROLAC TROMETHAMINE 30 MG/ML IJ SOLN
30.0000 mg | Freq: Four times a day (QID) | INTRAMUSCULAR | Status: AC
  Administered 2012-03-05 – 2012-03-06 (×3): 30 mg via INTRAVENOUS
  Filled 2012-03-05 (×3): qty 1

## 2012-03-05 MED ORDER — KETOROLAC TROMETHAMINE 30 MG/ML IJ SOLN: 30.0000 mg | Freq: Four times a day (QID) | INTRAMUSCULAR | Status: AC

## 2012-03-05 MED ORDER — HYDROMORPHONE HCL PF 1 MG/ML IJ SOLN: 0.2500 mg | INTRAMUSCULAR | Status: DC | PRN

## 2012-03-05 MED ORDER — ONDANSETRON HCL 4 MG/2ML IJ SOLN: 4.0000 mg | Freq: Four times a day (QID) | INTRAMUSCULAR | Status: DC | PRN

## 2012-03-05 MED ORDER — MORPHINE SULFATE 2 MG/ML IJ SOLN: 2.0000 mg | INTRAMUSCULAR | Status: DC | PRN

## 2012-03-05 MED ORDER — ZOLPIDEM TARTRATE 5 MG PO TABS: 5.0000 mg | ORAL_TABLET | Freq: Every evening | ORAL | Status: DC | PRN

## 2012-03-05 MED ORDER — KCL IN DEXTROSE-NACL 20-5-0.45 MEQ/L-%-% IV SOLN
INTRAVENOUS | Status: DC
  Administered 2012-03-05: 125 mL via INTRAVENOUS
  Administered 2012-03-06: 06:00:00 via INTRAVENOUS
  Filled 2012-03-05 (×3): qty 1000

## 2012-03-05 MED ORDER — ONDANSETRON HCL 4 MG/2ML IJ SOLN
INTRAMUSCULAR | Status: DC | PRN
  Administered 2012-03-05: 2 mg via INTRAVENOUS

## 2012-03-05 MED ORDER — NEOSTIGMINE METHYLSULFATE 1 MG/ML IJ SOLN
INTRAMUSCULAR | Status: DC | PRN
  Administered 2012-03-05: 3 mg via INTRAVENOUS

## 2012-03-05 MED ORDER — PROPOFOL 10 MG/ML IV EMUL
INTRAVENOUS | Status: DC | PRN
  Administered 2012-03-05: 200 mg via INTRAVENOUS

## 2012-03-05 MED ORDER — CISATRACURIUM BESYLATE (PF) 10 MG/5ML IV SOLN
INTRAVENOUS | Status: DC | PRN
  Administered 2012-03-05: 2 mg via INTRAVENOUS
  Administered 2012-03-05: 8 mg via INTRAVENOUS
  Administered 2012-03-05: 1 mg via INTRAVENOUS
  Administered 2012-03-05: 2 mg via INTRAVENOUS
  Administered 2012-03-05: 4 mg via INTRAVENOUS
  Administered 2012-03-05: 1 mg via INTRAVENOUS

## 2012-03-05 SURGICAL SUPPLY — 58 items
ADH SKN CLS APL DERMABOND .7 (GAUZE/BANDAGES/DRESSINGS) ×2
APL SKNCLS STERI-STRIP NONHPOA (GAUZE/BANDAGES/DRESSINGS)
BAG SPEC RTRVL LRG 6X4 10 (ENDOMECHANICALS)
BENZOIN TINCTURE PRP APPL 2/3 (GAUZE/BANDAGES/DRESSINGS) IMPLANT
CHLORAPREP W/TINT 26ML (MISCELLANEOUS) ×3 IMPLANT
CLOTH BEACON ORANGE TIMEOUT ST (SAFETY) ×3 IMPLANT
CORD HIGH FREQUENCY UNIPOLAR (ELECTROSURGICAL) ×4 IMPLANT
CORDS BIPOLAR (ELECTRODE) ×3 IMPLANT
COVER MAYO STAND STRL (DRAPES) ×3 IMPLANT
COVER SURGICAL LIGHT HANDLE (MISCELLANEOUS) ×3 IMPLANT
COVER TIP SHEARS 8 DVNC (MISCELLANEOUS) ×2 IMPLANT
COVER TIP SHEARS 8MM DA VINCI (MISCELLANEOUS) ×1
DECANTER SPIKE VIAL GLASS SM (MISCELLANEOUS) ×2 IMPLANT
DERMABOND ADVANCED (GAUZE/BANDAGES/DRESSINGS) ×1
DERMABOND ADVANCED .7 DNX12 (GAUZE/BANDAGES/DRESSINGS) IMPLANT
DRAPE LG THREE QUARTER DISP (DRAPES) ×6 IMPLANT
DRAPE SURG IRRIG POUCH 19X23 (DRAPES) ×3 IMPLANT
DRAPE TABLE BACK 44X90 PK DISP (DRAPES) ×6 IMPLANT
DRAPE UTILITY XL STRL (DRAPES) ×3 IMPLANT
DRAPE WARM FLUID 44X44 (DRAPE) ×3 IMPLANT
DRSG TEGADERM 6X8 (GAUZE/BANDAGES/DRESSINGS) ×6 IMPLANT
ELECT REM PT RETURN 9FT ADLT (ELECTROSURGICAL) ×3
ELECTRODE REM PT RTRN 9FT ADLT (ELECTROSURGICAL) ×2 IMPLANT
GAUZE VASELINE 3X9 (GAUZE/BANDAGES/DRESSINGS) IMPLANT
GLOVE BIO SURGEON STRL SZ 6.5 (GLOVE) ×11 IMPLANT
GLOVE BIO SURGEON STRL SZ7.5 (GLOVE) ×4 IMPLANT
GLOVE BIO SURGEON STRL SZ8 (GLOVE) ×6 IMPLANT
GLOVE BIOGEL PI IND STRL 7.0 (GLOVE) ×4 IMPLANT
GLOVE BIOGEL PI INDICATOR 7.0 (GLOVE)
GOWN PREVENTION PLUS XLARGE (GOWN DISPOSABLE) ×6 IMPLANT
GOWN STRL NON-REIN LRG LVL3 (GOWN DISPOSABLE) ×8 IMPLANT
HOLDER FOLEY CATH W/STRAP (MISCELLANEOUS) ×3 IMPLANT
KIT ACCESSORY DA VINCI DISP (KITS) ×1
KIT ACCESSORY DVNC DISP (KITS) ×2 IMPLANT
MANIPULATOR UTERINE 4.5 ZUMI (MISCELLANEOUS) ×3 IMPLANT
OCCLUDER COLPOPNEUMO (BALLOONS) ×3 IMPLANT
PACK LAPAROSCOPY W LONG (CUSTOM PROCEDURE TRAY) ×3 IMPLANT
PEN SKIN MARKING BROAD (MISCELLANEOUS) ×1 IMPLANT
POUCH SPECIMEN RETRIEVAL 10MM (ENDOMECHANICALS) ×4 IMPLANT
SET TUBE IRRIG SUCTION NO TIP (IRRIGATION / IRRIGATOR) ×3 IMPLANT
SHEET LAVH (DRAPES) ×3 IMPLANT
SOLUTION ELECTROLUBE (MISCELLANEOUS) ×3 IMPLANT
SPONGE LAP 18X18 X RAY DECT (DISPOSABLE) IMPLANT
STRIP CLOSURE SKIN 1/2X4 (GAUZE/BANDAGES/DRESSINGS) IMPLANT
SUT VIC AB 0 CT1 27 (SUTURE) ×3
SUT VIC AB 0 CT1 27XBRD ANTBC (SUTURE) ×6 IMPLANT
SUT VIC AB 4-0 PS2 27 (SUTURE) ×7 IMPLANT
SUT VICRYL 0 UR6 27IN ABS (SUTURE) ×3 IMPLANT
SYR 50ML LL SCALE MARK (SYRINGE) ×3 IMPLANT
SYR BULB IRRIGATION 50ML (SYRINGE) IMPLANT
TOWEL OR 17X26 10 PK STRL BLUE (TOWEL DISPOSABLE) ×6 IMPLANT
TOWEL OR NON WOVEN STRL DISP B (DISPOSABLE) ×3 IMPLANT
TRAP SPECIMEN MUCOUS 40CC (MISCELLANEOUS) ×2 IMPLANT
TRAY FOLEY CATH 14FRSI W/METER (CATHETERS) ×3 IMPLANT
TROCAR 12M 150ML BLUNT (TROCAR) ×3 IMPLANT
TROCAR XCEL 12X100 BLDLESS (ENDOMECHANICALS) ×3 IMPLANT
TROCAR XCEL NON-BLD 5MMX100MML (ENDOMECHANICALS) ×7 IMPLANT
WATER STERILE IRR 1500ML POUR (IV SOLUTION) ×4 IMPLANT

## 2012-03-05 NOTE — Op Note (Signed)
Pre-operative Diagnosis: abnormal uterine bleeding and adnexal mass  Post-operative Diagnosis: abnormal uterine bleeding and adhesions, left-sided hydrosalpinx  Operation: Robotic-assisted hysterectomy with bilateral salpingectomy, lysis of adhesions  Surgeon: Roseanna Rainbow  Assistant: Coral Ceo, MD  Anesthesia: GET  Urine Output: per Anesthesiology  Findings: There were omental adhesions to the parieto peritoneum of the anterior cul de sac.  There were adhesions involving the anterior and posterior cul-de-sac.  The left ovary was scarred to the broad ligament.  There a large, left-sided hydrosalpinx.  There were pinpoint, hemorrhagic lesions on the peritoneum of the posterior cul-de-sac.  Estimated Blood Loss:  100 ml                 Total IV Fluids: per Anesthesiology         Specimens: PATHOLOGY               Complications:  None; patient tolerated the procedure well.         Disposition: PACU - hemodynamically stable.         Condition: Stable    Procedure Details  The patient was seen in the Holding Room. The risks, benefits, complications, treatment options, and expected outcomes were discussed with the patient.  The patient concurred with the proposed plan, giving informed consent.  The site of surgery properly noted/marked. The patient was identified as Carol Giles and the procedure verified as a Robotic-assisted hysterectomy with bilateral salpingectomy. A Time Out was held and the above information confirmed.  After induction of anesthesia, the patient was draped and prepped in the usual sterile manner. Pt was placed in supine position after anesthesia and draped and prepped in the usual sterile manner. The abdominal drape was placed after the CholoraPrep had been allowed to dry for 3 minutes.  Her arms were tucked to her side with all appropriate precautions.  The shoulder blocks were placed in the usual fashion.  The patient was placed in the  semi-lithotomy position in [place] stirrups.  The perineum was prepped with Betadine.  Foley catheter was placed.  A sterile speculum was placed in the vagina.  The cervix was grasped with a single-tooth tenaculum and dilated with Shawnie Pons dilators.  The ZUMI uterine manipulator with a medium colpotomizer ring was placed without difficulty.  A pneum occluder balloon was placed over the manipulator.  A second time-out was performed.  OG tube placement was confirmed and to suction.  Approximately 2 cm below the costal margin, in the midclavicular line the skin was anesthestized with 0.25% Marcaine.  A 5 mm incision was made and using a 5 mm Optiview, a 5 mm trocar was placed under direct vision.  The patient's abdomen was insufflated with CO2 gas.  At this point and all points during the procedure, the patient's intra-abdominal pressure did not exceed 15 mmHg.  A 10-12 camera port was place 24 cm above the pubic symphysis.  Bilateral 8 mm ports were place 10 cm and 15 degrees inferior.  All ports were placed under direct visualization.  The 5 mm port was removed.  The incision was extended to accommodate a 10 mm trocar.  A 10 mm trocar and sleeve were advanced under direct visualization.   The robot was docked in the usual fashion.  The round ligament on the right was transected with monopolar cautery.  The anterior and posterior leaves of the broad ligament were opened.  The ureter was identified.  A window was made between the infundibulopelvic ligament and the  ureter.  The utero-ovarian ligament and proximal fallopian tube were coagulated with bipolar cautery and transected.  The ovary was freed up from the broad ligament using sharp dissection.  The uterine vessels were skeletonized to the level of the Koh ring.  The uterine vessels were coagulated with bipolar cautery and transected. A C-loop was created in the usual fashion.  A similar procedure was performed on the patient's left side. The round ligament on the left  was transected with monopolar cautery.  The anterior and posterior leaves of the broad ligament were opened.  The ureter was identified.  A window was made between the infundibulopelvic ligament and the ureter.  The utero-ovarian ligament and proximal fallopian tube were coagulated with bipolar cautery and transected.  The uterine vessels were skeletonized to the level of the Koh ring.  The adhesions involving the posterior cul-de-sac were divided using sharp and blunt dissection.  The uterine vessels were coagulated with bipolar cautery and transected.  A C-loop was created in the usual fashion.   The bladder flap was completed.  At this time, the pneum occluder balloon was insufflated and a colpotomy was performed.  The uterus, cervix and left fallopian tube were delivered through the vagina.  All pedicles were inspected under low intraabdominal pressures and noted to be hemostatic.  The vagina cuff was closed using 0-Vicryl on a CT-1 needle in a running fashion.  The abdomen and pelvis were copiously irrigated.  The needle was removed without difficulty.  The instruments were then removed under direct visualization and the robotic ports removed.  The robot was undocked.  Deep, subcutaneous, figure-of-eight 0-Vicryl sutures on a UR-6 needle were placed in the 10-12 mm supraumbilical and infracostal incisions.  All skin incisions were closed in a subcuticular fashion using 3-0 Monocryl.  A skin adhesive was applied to the incisions.  The vagina was swabbed with minimal bleeding noted.   All instrument and needle counts were correct x  2.

## 2012-03-05 NOTE — Interval H&P Note (Signed)
History and Physical Interval Note:  03/05/2012 11:16 AM  Carol Giles  has presented today for surgery, with the diagnosis of Abnormal uterine bleeding  The various methods of treatment have been discussed with the patient and family. After consideration of risks, benefits and other options for treatment, the patient has consented to  Procedure(s) (LRB) with comments: ROBOTIC ASSISTED TOTAL HYSTERECTOMY (N/A) BILATERAL SALPINGECTOMY (Bilateral) as a surgical intervention .  The patient's history has been reviewed, patient examined, no change in status, stable for surgery.  I have reviewed the patient's chart and labs.  Questions were answered to the patient's satisfaction.     Frosch-MOORE,Aldwin Micalizzi A

## 2012-03-05 NOTE — Transfer of Care (Signed)
Immediate Anesthesia Transfer of Care Note  Patient: Carol Giles  Procedure(s) Performed: Procedure(s) (LRB) with comments: ROBOTIC ASSISTED TOTAL HYSTERECTOMY (N/A) BILATERAL SALPINGECTOMY (Bilateral)  Patient Location: PACU  Anesthesia Type:General  Level of Consciousness: awake and sedated  Airway & Oxygen Therapy: Patient Spontanous Breathing, Patient connected to face mask oxygen and Patient connected to face mask  Post-op Assessment: Report given to PACU RN, Post -op Vital signs reviewed and stable and Post -oPost vital signs: Reviewed and stable  Complications: No apparent anesthesia complications

## 2012-03-05 NOTE — Anesthesia Preprocedure Evaluation (Signed)
Anesthesia Evaluation  Patient identified by MRN, date of birth, ID band Patient awake    Reviewed: Allergy & Precautions, H&P , NPO status , Patient's Chart, lab work & pertinent test results, reviewed documented beta blocker date and time   Airway Mallampati: II TM Distance: >3 FB Neck ROM: full    Dental No notable dental hx.    Pulmonary Recent URI ,  breath sounds clear to auscultation  Pulmonary exam normal       Cardiovascular Exercise Tolerance: Good negative cardio ROS  Rhythm:regular Rate:Normal     Neuro/Psych negative neurological ROS  negative psych ROS   GI/Hepatic negative GI ROS, Neg liver ROS,   Endo/Other  negative endocrine ROS  Renal/GU negative Renal ROS  negative genitourinary   Musculoskeletal   Abdominal   Peds  Hematology negative hematology ROS (+)   Anesthesia Other Findings   Reproductive/Obstetrics negative OB ROS                           Anesthesia Physical Anesthesia Plan  ASA: II  Anesthesia Plan: General ETT   Post-op Pain Management:    Induction:   Airway Management Planned:   Additional Equipment:   Intra-op Plan:   Post-operative Plan:   Informed Consent: I have reviewed the patients History and Physical, chart, labs and discussed the procedure including the risks, benefits and alternatives for the proposed anesthesia with the patient or authorized representative who has indicated his/her understanding and acceptance.   Dental Advisory Given  Plan Discussed with: CRNA  Anesthesia Plan Comments:         Anesthesia Quick Evaluation

## 2012-03-06 LAB — BASIC METABOLIC PANEL
BUN: 3 mg/dL — ABNORMAL LOW (ref 6–23)
CO2: 27 mEq/L (ref 19–32)
Chloride: 100 mEq/L (ref 96–112)
GFR calc non Af Amer: >90 mL/min (ref >90)
Glucose, Bld: 149 mg/dL — ABNORMAL HIGH (ref 70–99)
Potassium: 4.3 mEq/L (ref 3.5–5.1)

## 2012-03-06 LAB — CBC
HCT: 33.1 % — ABNORMAL LOW (ref 36.0–46.0)
Hemoglobin: 11.6 g/dL — ABNORMAL LOW (ref 12.0–15.0)
MCH: 31.1 pg (ref 26.0–34.0)
MCHC: 35 g/dL (ref 30.0–36.0)
MCV: 88.7 fL (ref 78.0–100.0)
Platelets: 314 10*3/uL (ref 150–400)
RBC: 3.73 MIL/uL — ABNORMAL LOW (ref 3.87–5.11)
RDW: 12.6 % (ref 11.5–15.5)
WBC: 13.9 10*3/uL — ABNORMAL HIGH (ref 4.0–10.5)

## 2012-03-06 MED ORDER — OXYCODONE-ACETAMINOPHEN 5-325 MG PO TABS: 1.0000 | ORAL_TABLET | Freq: Four times a day (QID) | ORAL | Status: DC | PRN

## 2012-03-06 NOTE — Anesthesia Postprocedure Evaluation (Signed)
  Anesthesia Post-op Note  Patient: Carol Giles  Procedure(s) Performed: Procedure(s) (LRB): ROBOTIC ASSISTED TOTAL HYSTERECTOMY (N/A) BILATERAL SALPINGECTOMY (Bilateral)  Patient Location: PACU  Anesthesia Type: General  Level of Consciousness: awake and alert   Airway and Oxygen Therapy: Patient Spontanous Breathing  Post-op Pain: mild  Post-op Assessment: Post-op Vital signs reviewed, Patient's Cardiovascular Status Stable, Respiratory Function Stable, Patent Airway and No signs of Nausea or vomiting  Last Vitals:  Filed Vitals:   03/06/12 1004  BP: 100/65  Pulse: 88  Temp: 36.9 C  Resp: 18    Post-op Vital Signs: stable   Complications: No apparent anesthesia complications

## 2012-03-06 NOTE — Discharge Summary (Signed)
  Physician Discharge Summary  Patient ID: Carol Giles MRN: [id number] DOB/AGE: Jul 09, 1965 46 y.o.  Admit date: 03/05/2012 Discharge date: 03/06/2012  Admission Diagnoses: Active Problems:  Irregular menstrual cycle  Discharge Diagnoses:  Active Problems:  Irregular menstrual cycle   Discharged Condition: good  Hospital Course: On 03/05/2012, the patient underwent the following: Procedure(s): ROBOTIC ASSISTED TOTAL HYSTERECTOMY BILATERAL SALPINGECTOMY.   The postoperative course was uneventful.  She was discharged to home on postoperative day 1 tolerating a regular diet.  Consults: None  Significant Diagnostic Studies: none  Treatments: surgery: see above  Discharge Exam: Blood pressure 100/65, pulse 88, temperature 98.5 F (36.9 C), temperature source Oral, resp. rate 18, height 5\' 4"  (1.626 m), weight 81.647 kg (180 lb), last menstrual period 02/27/2012, SpO2 99.00%. General appearance: alert GI: soft, non-tender; bowel sounds normal; no masses,  no organomegaly Extremities: extremities normal, atraumatic, no cyanosis or edema Incision/Wound:  C/D/I  Disposition: 01-Home or Self Care  Discharge Orders    Future Orders Please Complete By Expires   Diet - low sodium heart healthy      Increase activity slowly      May walk up steps      May shower / Bathe      Comments:   No tub baths for 6 weeks   Driving Restrictions      Comments:   No driving while taking narcotics   Lifting restrictions      Comments:   No lifting > 5 lbs for 6 weeks   Sexual Activity Restrictions      Comments:   No intercourse for 8 weeks   Discharge wound care:      Comments:   Keep clean and dry   Call MD for:  temperature >100.4      Call MD for:  persistant nausea and vomiting      Call MD for:  severe uncontrolled pain      Call MD for:  redness, tenderness, or signs of infection (pain, swelling, redness, odor or green/yellow discharge around incision site)      Call MD  for:  persistant dizziness or light-headedness      Call MD for:  extreme fatigue          Medication List     As of 03/06/2012 12:06 PM    TAKE these medications         loratadine 10 MG tablet   Commonly known as: CLARITIN   Take 10 mg by mouth daily as needed. For seasonal allergies      oxyCODONE-acetaminophen 5-325 MG per tablet   Commonly known as: PERCOCET/ROXICET   Take 1-2 tablets by mouth every 6 (six) hours as needed (moderate to severe pain (when tolerating fluids)).           Follow-up Information    Follow up with Antionette Char A, MD. Schedule an appointment as soon as possible for a visit in 2 weeks.   Contact information:   [address] [place] [address] [phone]          Signed: CHRISELDA, LEPPERT 03/06/2012, 12:06 PM

## 2012-03-08 ENCOUNTER — Encounter (HOSPITAL_COMMUNITY): Payer: Self-pay | Admitting: Obstetrics & Gynecology

## 2012-03-18 ENCOUNTER — Encounter (HOSPITAL_COMMUNITY): Payer: Self-pay | Admitting: *Deleted

## 2012-03-18 ENCOUNTER — Inpatient Hospital Stay (HOSPITAL_COMMUNITY)
Admission: AD | Admit: 2012-03-18 | Discharge: 2012-03-18 | Disposition: A | Discharge status: Home / Self Care | Payer: 59 | Source: Ambulatory Visit | Attending: Obstetrics & Gynecology | Admitting: Obstetrics & Gynecology

## 2012-03-18 DIAGNOSIS — N939 Abnormal uterine and vaginal bleeding, unspecified: Secondary | ICD-10-CM

## 2012-03-18 DIAGNOSIS — N898 Other specified noninflammatory disorders of vagina: Secondary | ICD-10-CM

## 2012-03-18 DIAGNOSIS — IMO0002 Reserved for concepts with insufficient information to code with codable children: Secondary | ICD-10-CM | POA: Insufficient documentation

## 2012-03-18 NOTE — MAU Note (Signed)
Pt had a TRH on 03/04/2012-and started gaving vagnal bleeding this morning about0500

## 2012-03-18 NOTE — MAU Note (Signed)
PT SAYS SHE STARTED HAVING   RED VAG BLEEDING  THIS  AM- 0500.  UP TO B-ROOM - PASSED  1 QUARTER SIZE CLOT.   DENIES ANY PAIN.   THIS IS FIRST EPISODE OF BLEEDING  SINCE SURGERY ON 03-05-2012.    IN ROOM 10- SMALL AMT RED SMEARS ON PAD.

## 2012-03-18 NOTE — MAU Provider Note (Signed)
History     CSN: [id number]  Arrival date and time: 03/18/12 2130   First Provider Initiated Contact with Patient 03/18/12 [phone]      Chief Complaint  Patient presents with  . Vaginal Bleeding   HPI DIAMON REDDINGER 46 y.o.  Post op from total robotic assisted vaginal hysterectomy on 03-05-12.  Awakened at 5 am and went to the bathroom.  Had bleeding into the toilet.  Flushed and continued to have bleeding and one quarter sized clot noted.  Called Dr. Clearance Coots and was told to come to MAU.  Denies having any intercourse since surgery.  OB History    Grav Para Term Preterm Abortions TAB SAB Ect Mult Living                  Past Medical History  Diagnosis Date  . Recurrent upper respiratory infection (URI)     abx completed  . No pertinent past medical history     Past Surgical History  Procedure Date  . Tubal ligation 05/05/00  . Dilation and curettage of uterus 02/28/05  . Novasure ablation 02/28/05  . Wisdom tooth extraction   . Svd     x 2  . Bladder suspension 03/03/2011    Procedure: TRANSVAGINAL TAPE (TVT) PROCEDURE;  Surgeon: Roseanna Rainbow, MD;  Location: [facility];  Service: Gynecology;  Laterality: Bilateral;  transobturator mid-urethral sling placement   . Cystoscopy 03/03/2011    Procedure: CYSTOSCOPY;  Surgeon: Roseanna Rainbow, MD;  Location: [facility];  Service: Gynecology;;  . Robotic assisted total hysterectomy 03/05/2012    Procedure: ROBOTIC ASSISTED TOTAL HYSTERECTOMY;  Surgeon: Antionette Char, MD;  Location: [facility];  Service: Gynecology;  Laterality: N/A;  . Bilateral salpingectomy 03/05/2012    Procedure: BILATERAL SALPINGECTOMY;  Surgeon: Antionette Char, MD;  Location: [facility];  Service: Gynecology;  Laterality: Bilateral;    History reviewed. No pertinent family history.  History  Substance Use Topics  . Smoking status: Never Smoker   . Smokeless tobacco: Never Used  . Alcohol Use: No    Allergies:  Allergies  Allergen Reactions   . Penicillins Hives    Prescriptions prior to admission  Medication Sig Dispense Refill  . ciprofloxacin (CIPRO) 500 MG/5ML (10%) suspension Take by mouth 2 (two) times daily.      Marland Kitchen oxyCODONE-acetaminophen (PERCOCET/ROXICET) 5-325 MG per tablet Take 1-2 tablets by mouth every 6 (six) hours as needed (moderate to severe pain (when tolerating fluids)).  30 tablet  0  . traMADol (ULTRAM) 50 MG tablet Take 50 mg by mouth every 6 (six) hours as needed.      . loratadine (CLARITIN) 10 MG tablet Take 10 mg by mouth daily as needed. For seasonal allergies        ROS Physical Exam   Blood pressure 121/61, pulse 79, temperature 98.1 F (36.7 C), resp. rate 20, height 5\' 4"  (1.626 m), weight 34.02 kg (75 lb), last menstrual period 02/27/2012, SpO2 99.00%.  Physical Exam  Nursing note and vitals reviewed. Constitutional: She is oriented to person, place, and time. She appears well-developed and well-nourished. She appears distressed.  HENT:  Head: Normocephalic.  Eyes: EOM are normal.  Neck: Neck supple.  GI: Soft. There is no tenderness.  Genitourinary:       Small to moderate amount of red blood seen on pad.  Very gentle speculum exam done.  No active bleeding.  Small amount of dark blood seen in vagina.  No clots.  Musculoskeletal: Normal range of motion.  Neurological: She is alert and oriented to person, place, and time.  Skin: Skin is warm and dry.  Psychiatric: She has a normal mood and affect.    MAU Course  Procedures  MDM Consult with Dr. Tamela Oddi.  Client reassured that bleeding at 2 weeks postop sometimes occurs.  No active bleeding seen at present.  To follow up in the office later today.  Assessment and Plan  Postoperative vaginal bleeding.  Plan Be seen in the office today. Continue no intercourse.  Calli Bashor 03/18/2012, 6:07 AM

## 2012-12-22 ENCOUNTER — Other Ambulatory Visit: Payer: Self-pay | Admitting: Obstetrics & Gynecology

## 2012-12-22 DIAGNOSIS — Z1231 Encounter for screening mammogram for malignant neoplasm of breast: Secondary | ICD-10-CM

## 2012-12-31 ENCOUNTER — Ambulatory Visit (HOSPITAL_COMMUNITY): Payer: 59 | Attending: Obstetrics & Gynecology

## 2013-01-21 ENCOUNTER — Ambulatory Visit (HOSPITAL_COMMUNITY)
Admission: RE | Admit: 2013-01-21 | Discharge: 2013-01-21 | Disposition: A | Discharge status: Home / Self Care | Payer: 59 | Source: Ambulatory Visit | Attending: Obstetrics & Gynecology | Admitting: Obstetrics & Gynecology

## 2013-01-21 DIAGNOSIS — Z1231 Encounter for screening mammogram for malignant neoplasm of breast: Secondary | ICD-10-CM | POA: Insufficient documentation

## 2013-03-30 ENCOUNTER — Ambulatory Visit (INDEPENDENT_AMBULATORY_CARE_PROVIDER_SITE_OTHER): Payer: 59 | Admitting: Obstetrics & Gynecology

## 2013-03-30 ENCOUNTER — Encounter: Payer: Self-pay | Admitting: Obstetrics & Gynecology

## 2013-03-30 VITALS — BP 132/94 | HR 94 | Wt 209.6 lb

## 2013-03-30 DIAGNOSIS — Z01419 Encounter for gynecological examination (general) (routine) without abnormal findings: Secondary | ICD-10-CM

## 2013-03-30 LAB — CBC
HEMATOCRIT: 39.9 % (ref 36.0–46.0)
HEMOGLOBIN: 13.5 g/dL (ref 12.0–15.0)
MCH: 30.5 pg (ref 26.0–34.0)
MCHC: 33.8 g/dL (ref 30.0–36.0)
MCV: 90.3 fL (ref 78.0–100.0)
Platelets: 322 10*3/uL (ref 150–400)
RBC: 4.42 MIL/uL (ref 3.87–5.11)
RDW: 13.6 % (ref 11.5–15.5)
WBC: 5.9 10*3/uL (ref 4.0–10.5)

## 2013-03-30 LAB — HEMOGLOBIN A1C
HEMOGLOBIN A1C: 5.4 % (ref <5.7)
MEAN PLASMA GLUCOSE: 108 mg/dL (ref <117)

## 2013-03-30 NOTE — Progress Notes (Signed)
Subjective:     Jomarie LongsFrances A Hairston is a 48 y.o. female here for a routine exam.  Current complaints: none.  Personal health questionnaire reviewed: no.   Gynecologic History  Last mammogram: 10/14. Results were: normal  Obstetric History OB History  No data available     The following portions of the patient's history were reviewed and updated as appropriate: allergies, current medications, past family history, past medical history, past social history, past surgical history and problem list.  Review of Systems Pertinent items are noted in HPI.    Objective:      General appearance: alert Breasts: normal appearance, no masses or tenderness Abdomen: soft, non-tender; bowel sounds normal; no masses,  no organomegaly Pelvic:  external genitalia normal, no adnexal masses or tenderness,  and vagina normal with scant, white discharge       Assessment:    Healthy female exam.    Plan:   Orders Placed This Encounter  Procedures  . NMR Lipoprofile with Lipids  . CBC  . Comprehensive metabolic panel  . TSH  . Hemoglobin A1C  Return prn

## 2013-03-30 NOTE — Progress Notes (Deleted)
Subjective:     Carol Giles is a 48 y.o. female here for a routine exam.  Current complaints: ***.  Personal health questionnaire reviewed: yes.   Gynecologic History Patient's last menstrual period was 02/27/2012. Contraception: {method:5051} Last Pap: ***. Results were: normal Last mammogram: oct 2014. Results were: normal  Obstetric History OB History  No data available     The following portions of the patient's history were reviewed and updated as appropriate: allergies, current medications, past family history, past medical history, past social history, past surgical history and problem list.  Review of Systems {ros; complete:30496}    Objective:    {exam; complete:18323}    Assessment:    Healthy female exam.    Plan:    {plan:19193}

## 2013-03-30 NOTE — Patient Instructions (Signed)

## 2013-03-31 LAB — COMPREHENSIVE METABOLIC PANEL
ALBUMIN: 4.3 g/dL (ref 3.5–5.2)
ALK PHOS: 82 U/L (ref 39–117)
ALT: 21 U/L (ref 0–35)
AST: 17 U/L (ref 0–37)
BILIRUBIN TOTAL: 0.5 mg/dL (ref 0.3–1.2)
BUN: 9 mg/dL (ref 6–23)
CO2: 27 meq/L (ref 19–32)
Calcium: 9.3 mg/dL (ref 8.4–10.5)
Chloride: 102 mEq/L (ref 96–112)
Creat: 0.64 mg/dL (ref 0.50–1.10)
GLUCOSE: 67 mg/dL — AB (ref 70–99)
POTASSIUM: 4 meq/L (ref 3.5–5.3)
SODIUM: 139 meq/L (ref 135–145)
TOTAL PROTEIN: 6.9 g/dL (ref 6.0–8.3)

## 2013-03-31 LAB — TSH: TSH: 1.472 u[IU]/mL (ref 0.350–4.500)

## 2013-04-01 LAB — NMR LIPOPROFILE WITH LIPIDS
CHOLESTEROL, TOTAL: 162 mg/dL (ref <200)
HDL PARTICLE NUMBER: 36.5 umol/L (ref >=30.5)
HDL Size: 9.1 nm — ABNORMAL LOW (ref >=9.2)
HDL-C: 64 mg/dL (ref >=40)
LARGE VLDL-P: 3.2 nmol/L — AB (ref <=2.7)
LDL (calc): 77 mg/dL (ref <100)
LDL Particle Number: 807 nmol/L (ref <1000)
LDL Size: 21.3 nm (ref >20.5)
LP-IR Score: 45 (ref <=45)
Large HDL-P: 6.3 umol/L (ref >=4.8)
SMALL LDL PARTICLE NUMBER: 123 nmol/L (ref <=527)
TRIGLYCERIDES: 103 mg/dL (ref <150)
VLDL SIZE: 52.2 nm — AB (ref <=46.6)

## 2013-04-07 ENCOUNTER — Other Ambulatory Visit: Payer: 59

## 2013-04-07 DIAGNOSIS — Z205 Contact with and (suspected) exposure to viral hepatitis: Secondary | ICD-10-CM

## 2013-04-08 LAB — HEPATITIS C ANTIBODY: HCV Ab: NEGATIVE

## 2014-01-24 ENCOUNTER — Other Ambulatory Visit: Payer: Self-pay | Admitting: Obstetrics

## 2014-01-24 DIAGNOSIS — J0101 Acute recurrent maxillary sinusitis: Secondary | ICD-10-CM

## 2014-01-24 MED ORDER — AZITHROMYCIN 250 MG PO TABS: ORAL_TABLET | ORAL | Status: DC

## 2014-01-25 ENCOUNTER — Other Ambulatory Visit: Payer: Self-pay | Admitting: Obstetrics

## 2014-01-25 ENCOUNTER — Telehealth: Payer: Self-pay | Admitting: *Deleted

## 2014-01-25 DIAGNOSIS — L309 Dermatitis, unspecified: Secondary | ICD-10-CM

## 2014-01-25 MED ORDER — TRIAMCINOLONE ACETONIDE 0.1 % EX OINT: 1.0000 "application " | TOPICAL_OINTMENT | Freq: Two times a day (BID) | CUTANEOUS | Status: DC

## 2014-01-25 NOTE — Telephone Encounter (Signed)
Patient called and requesting a medication- steroid that she was given in the past for eczema.

## 2014-01-25 NOTE — Telephone Encounter (Signed)
Triamcinolone ointment Rx.

## 2014-02-01 NOTE — Telephone Encounter (Signed)
Patient aware RF sent over.

## 2014-02-21 ENCOUNTER — Telehealth: Payer: Self-pay | Admitting: Obstetrics & Gynecology

## 2014-02-21 NOTE — Telephone Encounter (Signed)
[id number] - Patient scheduled for 02/23/2014.brm

## 2014-02-21 NOTE — Telephone Encounter (Signed)
[id number] - Patient called requesting an annual appointment with Dr. Tamela OddiJackson-Moore. Patient also stated she had heard Dr. Tamela OddiJackson-Moore was leaving and wished to see her before the doctor left. I advised patient I would check and see with our nurse if maybe she had a few slots for annuals. brm

## 2014-02-21 NOTE — Telephone Encounter (Signed)
[id number] - Called patient and advised we do not have date that Dr. Tamela OddiJackson-Moore to leave and we will add her to our call list should any Fridays open for appointments and also if we have any cancellations.brm

## 2014-02-21 NOTE — Telephone Encounter (Signed)
[id number] - Called patient and advised we have no open appointments at this time and patient stated a March appt would be okay. brm

## 2014-02-23 ENCOUNTER — Ambulatory Visit: Payer: 59 | Admitting: Obstetrics & Gynecology

## 2014-03-01 ENCOUNTER — Ambulatory Visit: Payer: 59 | Admitting: Obstetrics & Gynecology

## 2014-03-02 ENCOUNTER — Ambulatory Visit (INDEPENDENT_AMBULATORY_CARE_PROVIDER_SITE_OTHER): Payer: BC Managed Care – PPO | Admitting: Obstetrics & Gynecology

## 2014-03-02 VITALS — BP 149/88 | HR 95 | Wt 202.0 lb

## 2014-03-02 DIAGNOSIS — Z01419 Encounter for gynecological examination (general) (routine) without abnormal findings: Secondary | ICD-10-CM

## 2014-03-02 LAB — COMPREHENSIVE METABOLIC PANEL
ALK PHOS: 91 U/L (ref 39–117)
ALT: 14 U/L (ref 0–35)
AST: 13 U/L (ref 0–37)
Albumin: 4.4 g/dL (ref 3.5–5.2)
BILIRUBIN TOTAL: 0.5 mg/dL (ref 0.2–1.2)
BUN: 10 mg/dL (ref 6–23)
CALCIUM: 9.3 mg/dL (ref 8.4–10.5)
CO2: 31 mEq/L (ref 19–32)
CREATININE: 0.77 mg/dL (ref 0.50–1.10)
Chloride: 101 mEq/L (ref 96–112)
Glucose, Bld: 100 mg/dL — ABNORMAL HIGH (ref 70–99)
Potassium: 4 mEq/L (ref 3.5–5.3)
Sodium: 138 mEq/L (ref 135–145)
Total Protein: 6.9 g/dL (ref 6.0–8.3)

## 2014-03-02 LAB — CBC
HCT: 38 % (ref 36.0–46.0)
Hemoglobin: 12.8 g/dL (ref 12.0–15.0)
MCH: 30.3 pg (ref 26.0–34.0)
MCHC: 33.7 g/dL (ref 30.0–36.0)
MCV: 90 fL (ref 78.0–100.0)
MPV: 10.1 fL (ref 9.4–12.4)
PLATELETS: 350 10*3/uL (ref 150–400)
RBC: 4.22 MIL/uL (ref 3.87–5.11)
RDW: 13.9 % (ref 11.5–15.5)
WBC: 8.1 10*3/uL (ref 4.0–10.5)

## 2014-03-02 NOTE — Progress Notes (Signed)
Subjective:     Carol Giles is a 48 y.o. female here for a routine exam.      Personal health questionnaire:  Is patient Ashkenazi Jewish, have a family history of breast and/or ovarian cancer: no Is there a family history of uterine cancer diagnosed at age < 9550, gastrointestinal cancer, urinary tract cancer, family member who is a Personnel officerLynch syndrome-associated carrier: no Is the patient overweight and hypertensive, family history of diabetes, personal history of gestational diabetes or PCOS: yes Is patient over 2255, have PCOS,  family history of premature CHD under age 48, diabetes, smoke, have hypertension or peripheral artery disease:  no At any time, has a partner hit, kicked or otherwise hurt or frightened you?: no Over the past 2 weeks, have you felt down, depressed or hopeless?: no Over the past 2 weeks, have you felt little interest or pleasure in doing things?:no   Gynecologic History Patient's last menstrual period was 02/27/2012. Contraception: status post hysterectomy Last mammogram: 2 yrs ago. Results were: normal  Obstetric History OB History  No data available    Past Medical History  Diagnosis Date  . Recurrent upper respiratory infection (URI)     abx completed  . No pertinent past medical history     Past Surgical History  Procedure Laterality Date  . Tubal ligation  05/05/00  . Dilation and curettage of uterus  02/28/05  . Novasure ablation  02/28/05  . Wisdom tooth extraction    . Svd      x 2  . Bladder suspension  03/03/2011    Procedure: TRANSVAGINAL TAPE (TVT) PROCEDURE;  Surgeon: Roseanna RainbowLisa A Kilmer-Moore, MD;  Location: [facility];  Service: Gynecology;  Laterality: Bilateral;  transobturator mid-urethral sling placement   . Cystoscopy  03/03/2011    Procedure: CYSTOSCOPY;  Surgeon: Roseanna RainbowLisa A Bergfeld-Moore, MD;  Location: [facility];  Service: Gynecology;;  . Robotic assisted total hysterectomy  03/05/2012    Procedure: ROBOTIC ASSISTED TOTAL HYSTERECTOMY;   Surgeon: Antionette CharLisa Lafosse-Moore, MD;  Location: [facility];  Service: Gynecology;  Laterality: N/A;  . Bilateral salpingectomy  03/05/2012    Procedure: BILATERAL SALPINGECTOMY;  Surgeon: Antionette CharLisa Ogle-Moore, MD;  Location: [facility];  Service: Gynecology;  Laterality: Bilateral;    Current outpatient prescriptions: loratadine (CLARITIN) 10 MG tablet, Take 10 mg by mouth daily as needed. For seasonal allergies, Disp: , Rfl: ;  azithromycin (ZITHROMAX Z-PAK) 250 MG tablet, Take 2 tablets po load, then 1 tablet po daily., Disp: 15 tablet, Rfl: 2;  triamcinolone ointment (KENALOG) 0.1 %, Apply 1 application topically 2 (two) times daily., Disp: 160 g, Rfl: 2 Allergies  Allergen Reactions  . Penicillins Hives    History  Substance Use Topics  . Smoking status: Never Smoker   . Smokeless tobacco: Never Used  . Alcohol Use: No    No family history on file.    Review of Systems  Constitutional: negative for fatigue and weight loss Respiratory: negative for cough and wheezing Cardiovascular: negative for chest pain, fatigue and palpitations Gastrointestinal: negative for abdominal pain and change in bowel habits Musculoskeletal:negative for myalgias Neurological: negative for gait problems and tremors Behavioral/Psych: negative for abusive relationship, depression Endocrine: negative for temperature intolerance   Genitourinary:negative for abnormal menstrual periods, genital lesions, hot flashes, sexual problems and vaginal discharge Integument/breast: negative for breast lump, breast tenderness, nipple discharge and skin lesion(s)    Objective:       BP 149/88 mmHg  Pulse 95  Wt 91.627 kg (202 lb)  LMP 02/27/2012 General:   alert  Skin:   no rash or abnormalities  Lungs:   clear to auscultation bilaterally  Heart:   regular rate and rhythm, S1, S2 normal, no murmur, click, rub or gallop  Breasts:   normal without suspicious masses, skin or nipple changes or axillary nodes  Abdomen:  normal  findings: no organomegaly, soft, non-tender and no hernia  Pelvis:  External genitalia: normal general appearance Urinary system: urethral meatus normal and bladder without fullness, nontender Vaginal: normal without tenderness, induration or masses Cervix: normal appearance Adnexa: normal bimanual exam    Lab Review Urine pregnancy test Labs reviewed no Radiologic studies reviewed no     Assessment:    Healthy female exam.    Plan:    Education reviewed: calcium supplements, low fat, low cholesterol diet and weight bearing exercise.  Screening MMG recommended  Orders Placed This Encounter  Procedures  . CBC  . Comprehensive metabolic panel  . TSH  . NMR Lipoprofile with Lipids  . Hemoglobin A1c    Follow up as needed.

## 2014-03-03 ENCOUNTER — Encounter: Payer: Self-pay | Admitting: Obstetrics & Gynecology

## 2014-03-03 LAB — HEMOGLOBIN A1C
HEMOGLOBIN A1C: 5.6 % (ref <5.7)
MEAN PLASMA GLUCOSE: 114 mg/dL (ref <117)

## 2014-03-03 LAB — TSH: TSH: 1.317 u[IU]/mL (ref 0.350–4.500)

## 2014-03-03 NOTE — Patient Instructions (Signed)

## 2014-03-04 LAB — NMR LIPOPROFILE WITH LIPIDS
CHOLESTEROL, TOTAL: 176 mg/dL (ref 100–199)
HDL PARTICLE NUMBER: 37.1 umol/L (ref >=30.5)
HDL Size: 9.3 nm (ref >=9.2)
HDL-C: 56 mg/dL (ref >39)
LDL CALC: 86 mg/dL (ref 0–99)
LDL Particle Number: 739 nmol/L (ref <1000)
LDL SIZE: 20.4 nm (ref >=20.8)
LP-IR SCORE: 47 — AB (ref <=45)
Large HDL-P: 8.8 umol/L (ref >=4.8)
Large VLDL-P: 7.2 nmol/L — ABNORMAL HIGH (ref <=2.7)
Small LDL Particle Number: 258 nmol/L (ref <=527)
TRIGLYCERIDES: 168 mg/dL — AB (ref 0–149)
VLDL Size: 51.2 nm — ABNORMAL HIGH (ref <=46.6)

## 2014-03-20 ENCOUNTER — Encounter: Payer: Self-pay | Admitting: *Deleted

## 2014-03-20 ENCOUNTER — Other Ambulatory Visit: Payer: Self-pay | Admitting: Obstetrics

## 2014-03-20 DIAGNOSIS — L309 Dermatitis, unspecified: Secondary | ICD-10-CM

## 2014-03-20 DIAGNOSIS — J0101 Acute recurrent maxillary sinusitis: Secondary | ICD-10-CM

## 2014-03-20 MED ORDER — TRIAMCINOLONE ACETONIDE 0.1 % EX OINT: 1.0000 "application " | TOPICAL_OINTMENT | Freq: Two times a day (BID) | CUTANEOUS | Status: DC

## 2014-03-21 ENCOUNTER — Encounter: Payer: Self-pay | Admitting: Obstetrics & Gynecology

## 2014-05-25 ENCOUNTER — Encounter: Payer: Self-pay | Admitting: Internal Medicine

## 2014-05-25 ENCOUNTER — Ambulatory Visit (INDEPENDENT_AMBULATORY_CARE_PROVIDER_SITE_OTHER): Payer: BLUE CROSS/BLUE SHIELD | Admitting: Internal Medicine

## 2014-05-25 VITALS — BP 152/88 | HR 83 | Temp 98.3°F | Resp 14 | Ht 64.5 in | Wt 196.6 lb

## 2014-05-25 DIAGNOSIS — Z23 Encounter for immunization: Secondary | ICD-10-CM

## 2014-05-25 DIAGNOSIS — J3089 Other allergic rhinitis: Secondary | ICD-10-CM

## 2014-05-25 DIAGNOSIS — Z299 Encounter for prophylactic measures, unspecified: Secondary | ICD-10-CM

## 2014-05-25 DIAGNOSIS — I10 Essential (primary) hypertension: Secondary | ICD-10-CM

## 2014-05-25 DIAGNOSIS — Z418 Encounter for other procedures for purposes other than remedying health state: Secondary | ICD-10-CM

## 2014-05-25 DIAGNOSIS — J309 Allergic rhinitis, unspecified: Secondary | ICD-10-CM | POA: Insufficient documentation

## 2014-05-25 DIAGNOSIS — E669 Obesity, unspecified: Secondary | ICD-10-CM

## 2014-05-25 MED ORDER — AMLODIPINE BESYLATE 5 MG PO TABS: 5.0000 mg | ORAL_TABLET | Freq: Every day | ORAL | Status: DC

## 2014-05-25 MED ORDER — FLUTICASONE PROPIONATE 50 MCG/ACT NA SUSP: 2.0000 | Freq: Every day | NASAL | Status: DC

## 2014-05-25 NOTE — Progress Notes (Signed)
   Subjective:    Patient ID: Carol MouldingFrances A Olano, female    DOB: 10/03/1965, 49 y.o.   MRN: [id number]  HPI The patient is a 49 YO female who is coming in new today for high blood pressure. She has noticed at home it has been high several times over the last 6 months. Her gynecologist also mentioned to her that it was a little high at their last visit. She has had mild headache but rare (1-2 /month). Denies chest pains, SOB, abdominal pain, nausea, vomiting. She has not tried anything for it. Has started exercising and dieting to lose weight.   Review of Systems  Constitutional: Positive for activity change. Negative for fever, chills, appetite change, fatigue and unexpected weight change.       Started exercising  HENT: Positive for congestion and rhinorrhea. Negative for sinus pressure, sneezing and sore throat.   Eyes: Negative.   Respiratory: Negative for cough, chest tightness, shortness of breath and wheezing.   Cardiovascular: Negative for chest pain, palpitations and leg swelling.  Gastrointestinal: Negative for nausea, abdominal pain, diarrhea, constipation and abdominal distention.  Endocrine: Negative.   Musculoskeletal: Positive for arthralgias. Negative for myalgias, back pain, joint swelling and gait problem.  Skin: Negative.   Neurological: Negative.   Psychiatric/Behavioral: Negative.       Objective:   Physical Exam  Constitutional: She is oriented to person, place, and time. She appears well-developed and well-nourished.  HENT:  Head: Normocephalic and atraumatic.  Right Ear: External ear normal.  Left Ear: External ear normal.  Eyes: EOM are normal.  Neck: Normal range of motion.  Cardiovascular: Normal rate and regular rhythm.   Pulmonary/Chest: Effort normal and breath sounds normal. No respiratory distress. She has no wheezes. She has no rales.  Abdominal: Soft. Bowel sounds are normal. She exhibits no distension. There is no tenderness.  Musculoskeletal: She  exhibits no edema.  Neurological: She is alert and oriented to person, place, and time. Coordination normal.  Skin: Skin is warm and dry.  Psychiatric: She has a normal mood and affect.   Filed Vitals:   05/25/14 0827  BP: 152/88  Pulse: 83  Temp: 98.3 F (36.8 C)  TempSrc: Oral  Resp: 14  Height: 5' 4.5" (1.638 m)  Weight: 196 lb 9.6 oz (89.177 kg)  SpO2: 98%      Assessment & Plan:  Tdap given today

## 2014-05-25 NOTE — Assessment & Plan Note (Signed)
New diagnosis for her today. In chart review would appear that her blood pressure has been elevated 2-3 times in the past along with verbal report of home readings. Will start amlodipine 5 mg daily. She will continue to work on diet and exercise to lose weight which will help her blood pressure as well. Return in 3-6 months for recheck.

## 2014-05-25 NOTE — Patient Instructions (Signed)
I have sent in the flonase which is a nose spray that has steroids in it to help with the allergies. Use 2 puffs in each nostril once a day to keep the allergies away.   I have also sent in a low dose blood pressure medicine called amlodipine which you take once a day. We will see you back in about 3-6 months to check on the weight and the blood pressure.   We have given you the tetanus shot today. We do not need any labs today.  You should work up to exercising about 6 days per week for 45-60 minutes per day.   Calorie Counting for Weight Loss Calories are energy you get from the things you eat and drink. Your body uses this energy to keep you going throughout the day. The number of calories you eat affects your weight. When you eat more calories than your body needs, your body stores the extra calories as fat. When you eat fewer calories than your body needs, your body burns fat to get the energy it needs. Calorie counting means keeping track of how many calories you eat and drink each day. If you make sure to eat fewer calories than your body needs, you should lose weight. In order for calorie counting to work, you will need to eat the number of calories that are right for you in a day to lose a healthy amount of weight per week. A healthy amount of weight to lose per week is usually 1-2 lb (0.5-0.9 kg). A dietitian can determine how many calories you need in a day and give you suggestions on how to reach your calorie goal.  WHAT IS MY MY PLAN? My goal is to have __1800________ calories per day.  If I have this many calories per day, I should lose around __________ pounds per week. WHAT DO I NEED TO KNOW ABOUT CALORIE COUNTING? In order to meet your daily calorie goal, you will need to:  Find out how many calories are in each food you would like to eat. Try to do this before you eat.  Decide how much of the food you can eat.  Write down what you ate and how many calories it had. Doing this is  called keeping a food log. WHERE DO I FIND CALORIE INFORMATION? The number of calories in a food can be found on a Nutrition Facts label. Note that all the information on a label is based on a specific serving of the food. If a food does not have a Nutrition Facts label, try to look up the calories online or ask your dietitian for help. HOW DO I DECIDE HOW MUCH TO EAT? To decide how much of the food you can eat, you will need to consider both the number of calories in one serving and the size of one serving. This information can be found on the Nutrition Facts label. If a food does not have a Nutrition Facts label, look up the information online or ask your dietitian for help. Remember that calories are listed per serving. If you choose to have more than one serving of a food, you will have to multiply the calories per serving by the amount of servings you plan to eat. For example, the label on a package of bread might say that a serving size is 1 slice and that there are 90 calories in a serving. If you eat 1 slice, you will have eaten 90 calories. If you eat 2  slices, you will have eaten 180 calories. HOW DO I KEEP A FOOD LOG? After each meal, record the following information in your food log:  What you ate.  How much of it you ate.  How many calories it had.  Then, add up your calories. Keep your food log near you, such as in a small notebook in your pocket. Another option is to use a mobile app or website. Some programs will calculate calories for you and show you how many calories you have left each time you add an item to the log. WHAT ARE SOME CALORIE COUNTING TIPS?  Use your calories on foods and drinks that will fill you up and not leave you hungry. Some examples of this include foods like nuts and nut butters, vegetables, lean proteins, and high-fiber foods (more than 5 g fiber per serving).  Eat nutritious foods and avoid empty calories. Empty calories are calories you get from foods  or beverages that do not have many nutrients, such as candy and soda. It is better to have a nutritious high-calorie food (such as an avocado) than a food with few nutrients (such as a bag of chips).  Know how many calories are in the foods you eat most often. This way, you do not have to look up how many calories they have each time you eat them.  Look out for foods that may seem like low-calorie foods but are really high-calorie foods, such as baked goods, soda, and fat-free candy.  Pay attention to calories in drinks. Drinks such as sodas, specialty coffee drinks, alcohol, and juices have a lot of calories yet do not fill you up. Choose low-calorie drinks like water and diet drinks.  Focus your calorie counting efforts on higher calorie items. Logging the calories in a garden salad that contains only vegetables is less important than calculating the calories in a milk shake.  Find a way of tracking calories that works for you. Get creative. Most people who are successful find ways to keep track of how much they eat in a day, even if they do not count every calorie. WHAT ARE SOME PORTION CONTROL TIPS?  Know how many calories are in a serving. This will help you know how many servings of a certain food you can have.  Use a measuring cup to measure serving sizes. This is helpful when you start out. With time, you will be able to estimate serving sizes for some foods.  Take some time to put servings of different foods on your favorite plates, bowls, and cups so you know what a serving looks like.  Try not to eat straight from a bag or box. Doing this can lead to overeating. Put the amount you would like to eat in a cup or on a plate to make sure you are eating the right portion.  Use smaller plates, glasses, and bowls to prevent overeating. This is a quick and easy way to practice portion control. If your plate is smaller, less food can fit on it.  Try not to multitask while eating, such as  watching TV or using your computer. If it is time to eat, sit down at a table and enjoy your food. Doing this will help you to start recognizing when you are full. It will also make you more aware of what and how much you are eating. HOW CAN I CALORIE COUNT WHEN EATING OUT?  Ask for smaller portion sizes or child-sized portions.  Consider sharing an  entree and sides instead of getting your own entree.  If you get your own entree, eat only half. Ask for a box at the beginning of your meal and put the rest of your entree in it so you are not tempted to eat it.  Look for the calories on the menu. If calories are listed, choose the lower calorie options.  Choose dishes that include vegetables, fruits, whole grains, low-fat dairy products, and lean protein. Focusing on smart food choices from each of the 5 food groups can help you stay on track at restaurants.  Choose items that are boiled, broiled, grilled, or steamed.  Choose water, milk, unsweetened iced tea, or other drinks without added sugars. If you want an alcoholic beverage, choose a lower calorie option. For example, a regular margarita can have up to 700 calories and a glass of wine has around 150.  Stay away from items that are buttered, battered, fried, or served with cream sauce. Items labeled "crispy" are usually fried, unless stated otherwise.  Ask for dressings, sauces, and syrups on the side. These are usually very high in calories, so do not eat much of them.  Watch out for salads. Many people think salads are a healthy option, but this is often not the case. Many salads come with bacon, fried chicken, lots of cheese, fried chips, and dressing. All of these items have a lot of calories. If you want a salad, choose a garden salad and ask for grilled meats or steak. Ask for the dressing on the side, or ask for olive oil and vinegar or lemon to use as dressing.  Estimate how many servings of a food you are given. For example, a  serving of cooked rice is  cup or about the size of half a tennis ball or one cupcake wrapper. Knowing serving sizes will help you be aware of how much food you are eating at restaurants. The list below tells you how big or small some common portion sizes are based on everyday objects.  1 oz--4 stacked dice.  3 oz--1 deck of cards.  1 tsp--1 dice.  1 Tbsp-- a Ping-Pong ball.  2 Tbsp--1 Ping-Pong ball.   cup--1 tennis ball or 1 cupcake wrapper.  1 cup--1 baseball. Document Released: 03/10/2005 Document Revised: 07/25/2013 Document Reviewed: 01/13/2013 [facility] Patient Information 2015 [place], [place]. This information is not intended to replace advice given to you by your health care provider. Make sure you discuss any questions you have with your health care provider.  Serving Sizes What we call a serving size today is larger than it was in the past. A 1950s fast-food burger contained little more than 1 oz of meat, and a soft drink was 8 oz (1 cup). Today, a "quarter pounder" burger is at least 4 times that amount, and a 32 or 64 oz drink is not uncommon. A possible guide for eating when trying to lose weight is to eat about half as much as you normally do. Some estimates of serving sizes are:  1 Dairy serving:Individual container of yogurt (8 oz) or piece of cheese the size of your thumb (1 oz).  1 Grain serving: 1 slice of bread or  cup pasta.  1 Meat serving: The size of a deck of cards (3 oz).  1 Fruit serving: cup canned fruit or 1 medium fruit.  1 Vegetable serving:  cup of cooked or canned vegetables.  1 Fat serving:The size of 4 stacked dimes. Experts suggest spending 1 or  2 days measuring food portions you commonly eat. This will give you better practice at estimating serving sizes, and will also show whether you are eating an appropriate amount of food to meet your weight goals. If you find that you are eating more than you thought, try measuring your food for a few  days so you can "reprogram" yourself to learn what makes a healthy portion for you. SUGGESTIONS FOR CONTROL  In restaurants, share entrees, or ask the waiter to put half the entre in a box or bag before you even touch it.  Order lunch-sized portions. Many restaurants serve 4 to 6 oz of meat at lunch, compared with 8 to 10 oz at dinner.  Split dessert or skip it all together. Have a piece of fruit when you get home.  At home, use smaller plates and bowls. It will look as if you are eating more.  Plate your food in the kitchen rather than serving it "family style" at the table.  Wait 20 to 30 minutes before taking seconds. This is how long it takes your brain to recognize that you are full.  Check food labels for serving sizes. Eat 1 serving only.  Use measuring cups and spoons to see proper serving sizes.  Buy smaller packages of candy, popcorn, and snacks.  Avoid eating directly out of the bag or carton.  While eating half as much, exercise twice as much. Park further away from the mall, take the stairs instead of the escalator, and walk around your block. Losing weight is a slow, difficult process. It takes long-lasting lifestyle changes. You can make gradual changes over time so they become habits. Look to friends and family to support the healthy changes you are making. Avoid fad diets since they are often only temporary weight loss solutions. Document Released: 12/07/2002 Document Revised: 06/02/2011 Document Reviewed: 06/07/2013 [facility] Patient Information 2015 [place], [place]. This information is not intended to replace advice given to you by your health care provider. Make sure you discuss any questions you have with your health care provider.

## 2014-05-25 NOTE — Assessment & Plan Note (Signed)
She has already started exercising. Talked with her about diet today and given information on calorie counting and serving size. Let her know that if she is struggling with her diet we can send her to a nutritionist for a consult. She will work on it.

## 2014-05-25 NOTE — Progress Notes (Signed)
Pre visit review using our clinic review tool, if applicable. No additional management support is needed unless otherwise documented below in the visit note. 

## 2014-05-25 NOTE — Assessment & Plan Note (Signed)
She has been taking claritin-d (not recently) and informed her that this could affect her blood pressure. She will try flonase to see if it helps as much or more.

## 2014-07-24 ENCOUNTER — Other Ambulatory Visit: Payer: Self-pay | Admitting: Geriatric Medicine

## 2014-07-24 ENCOUNTER — Telehealth: Payer: Self-pay | Admitting: Internal Medicine

## 2014-07-24 MED ORDER — AMLODIPINE BESYLATE 5 MG PO TABS: 5.0000 mg | ORAL_TABLET | Freq: Every day | ORAL | Status: DC

## 2014-07-24 NOTE — Telephone Encounter (Signed)
Patient states she has changed and she has to use [facility] on [address].  The scripts have to be 90 day supply.  She is requesting this to be changed in the system and she is requesting also a script for amlodipine to be sent.

## 2014-07-24 NOTE — Telephone Encounter (Signed)
Amlodipine sent to [facility] on [address].

## 2014-10-25 ENCOUNTER — Ambulatory Visit (INDEPENDENT_AMBULATORY_CARE_PROVIDER_SITE_OTHER): Payer: BLUE CROSS/BLUE SHIELD | Admitting: Internal Medicine

## 2014-10-25 ENCOUNTER — Encounter: Payer: Self-pay | Admitting: Internal Medicine

## 2014-10-25 VITALS — BP 132/90 | HR 89 | Temp 98.8°F | Resp 14 | Ht 64.0 in | Wt 190.0 lb

## 2014-10-25 DIAGNOSIS — I10 Essential (primary) hypertension: Secondary | ICD-10-CM

## 2014-10-25 DIAGNOSIS — E669 Obesity, unspecified: Secondary | ICD-10-CM

## 2014-10-25 MED ORDER — FLUTICASONE PROPIONATE 50 MCG/ACT NA SUSP: 2.0000 | Freq: Every day | NASAL | Status: DC

## 2014-10-25 NOTE — Patient Instructions (Signed)
You are doing great and good job with the weight!  Come back in about 6 months.  Keep up the good work with the exercise.

## 2014-10-25 NOTE — Assessment & Plan Note (Signed)
Weight doing better and she is making dietary and exercise changes doing well. Encouraged her and congratulated her. She will continue doing well.

## 2014-10-25 NOTE — Progress Notes (Signed)
   Subjective:    Patient ID: Carol Giles, female    DOB: 06-26-1965, 49 y.o.   MRN: [id number]  HPI The patient is a 49 YO female coming in for follow up of her blood pressure. She started taking amlodipine daily and has not noticed any side effects. Overall her blood pressures are better at home. She has been working on her diet and increasing her exercise to help with her weight. She is down several pounds from last time. No new concerns or complaints.   Review of Systems  Constitutional: Positive for activity change. Negative for fever, chills, appetite change, fatigue and unexpected weight change.       Started exercising  HENT: Negative for sinus pressure, sneezing and sore throat.   Eyes: Negative.   Respiratory: Negative for cough, chest tightness, shortness of breath and wheezing.   Cardiovascular: Negative for chest pain, palpitations and leg swelling.  Gastrointestinal: Negative for nausea, abdominal pain, diarrhea, constipation and abdominal distention.  Endocrine: Negative.   Musculoskeletal: Negative for myalgias, back pain, joint swelling and gait problem.  Skin: Negative.   Neurological: Negative.   Psychiatric/Behavioral: Negative.       Objective:   Physical Exam  Constitutional: She is oriented to person, place, and time. She appears well-developed and well-nourished.  HENT:  Head: Normocephalic and atraumatic.  Right Ear: External ear normal.  Left Ear: External ear normal.  Eyes: EOM are normal.  Neck: Normal range of motion.  Cardiovascular: Normal rate and regular rhythm.   Pulmonary/Chest: Effort normal and breath sounds normal. No respiratory distress. She has no wheezes. She has no rales.  Abdominal: Soft. Bowel sounds are normal. She exhibits no distension. There is no tenderness.  Musculoskeletal: She exhibits no edema.  Neurological: She is alert and oriented to person, place, and time. Coordination normal.  Skin: Skin is warm and dry.   Filed  Vitals:   10/25/14 0837  BP: 132/90  Pulse: 89  Temp: 98.8 F (37.1 C)  TempSrc: Oral  Resp: 14  Height:  (1.626 m)  Weight: 190 lb (86.183 kg)  SpO2: 98%      Assessment & Plan:

## 2014-10-25 NOTE — Assessment & Plan Note (Signed)
BP much better controlled, continue amlodipine 5 mg daily. No need for labs today.

## 2014-10-25 NOTE — Progress Notes (Signed)
Pre visit review using our clinic review tool, if applicable. No additional management support is needed unless otherwise documented below in the visit note. 

## 2015-04-27 ENCOUNTER — Ambulatory Visit: Payer: BLUE CROSS/BLUE SHIELD | Admitting: Internal Medicine

## 2015-07-30 ENCOUNTER — Other Ambulatory Visit: Payer: Self-pay | Admitting: Internal Medicine

## 2016-01-09 ENCOUNTER — Telehealth: Payer: Self-pay | Admitting: *Deleted

## 2016-01-09 NOTE — Telephone Encounter (Signed)
Patient has sinus infection and is requesting a Z-pack. [facility]. [phone]

## 2016-01-10 ENCOUNTER — Other Ambulatory Visit: Payer: Self-pay | Admitting: Obstetrics

## 2016-01-10 DIAGNOSIS — J0100 Acute maxillary sinusitis, unspecified: Secondary | ICD-10-CM

## 2016-01-10 MED ORDER — AZITHROMYCIN 250 MG PO TABS: ORAL_TABLET | ORAL | 2 refills | Status: DC

## 2016-01-10 NOTE — Telephone Encounter (Signed)
Azithromycin Rx. 

## 2016-06-28 ENCOUNTER — Encounter: Payer: Self-pay | Admitting: Family Medicine

## 2016-06-28 ENCOUNTER — Ambulatory Visit (INDEPENDENT_AMBULATORY_CARE_PROVIDER_SITE_OTHER): Payer: 59 | Admitting: Family Medicine

## 2016-06-28 VITALS — BP 140/86 | HR 90 | Temp 99.4°F | Wt 204.2 lb

## 2016-06-28 DIAGNOSIS — R05 Cough: Secondary | ICD-10-CM | POA: Diagnosis not present

## 2016-06-28 DIAGNOSIS — I10 Essential (primary) hypertension: Secondary | ICD-10-CM

## 2016-06-28 DIAGNOSIS — R059 Cough, unspecified: Secondary | ICD-10-CM

## 2016-06-28 DIAGNOSIS — J0100 Acute maxillary sinusitis, unspecified: Secondary | ICD-10-CM

## 2016-06-28 MED ORDER — PREDNISONE 20 MG PO TABS: ORAL_TABLET | ORAL | 0 refills | Status: DC

## 2016-06-28 MED ORDER — AMLODIPINE BESYLATE 5 MG PO TABS: ORAL_TABLET | ORAL | 0 refills | Status: DC

## 2016-06-28 MED ORDER — DOXYCYCLINE HYCLATE 100 MG PO TABS: 100.0000 mg | ORAL_TABLET | Freq: Two times a day (BID) | ORAL | 0 refills | Status: DC

## 2016-06-28 MED ORDER — GUAIFENESIN-CODEINE 100-10 MG/5ML PO SOLN: 5.0000 mL | Freq: Four times a day (QID) | ORAL | 0 refills | Status: DC | PRN

## 2016-06-28 NOTE — Patient Instructions (Signed)
Sinsusitis Viral based on <10 days though I am concerned because she has worsened in last few days (could be double sickening). THere could be an allergic element here- will trial prednisone which would also help symptomatically from viral sinusitis. If she is not making progress in right direction in next 48-72 hours then Ms. Caniglia will take doxycycline antibiotic. In meantime she will also use codeine cough syrup (cannot drive for 8 hours after taking and should drink with full glass of water). We discussed chance this could be influenza (mild body aches) but with sinus symptoms and cough being most prominent symptoms likely not.   Finally, we reviewed reasons to return to care including if symptoms worsen or persist or new concerns arise (particularly fever or shortness of breath or very dizzy)    ALSO  refilled amlodipine and encouraged follow up with Dr. Okey Dupre in next 1-2 montsh to makes ure back in normal range

## 2016-06-28 NOTE — Progress Notes (Signed)
Pre visit review using our clinic review tool, if applicable. No additional management support is needed unless otherwise documented below in the visit note. 

## 2016-06-28 NOTE — Progress Notes (Signed)
PCP: Myrlene Broker, MD  Subjective:  Carol Giles is a 51 y.o. year old very pleasant female patient who presents with sinusitis symptoms including nasal congestion, sinus tenderness, cough mainly dry, drainage from nose- though mainly clear -day of illness:6 -Symptoms are worsening again in last few days including low grade temperature in 99s -previous treatments: delsym not helping -sick contacts/travel/risks: denies flu exposure. Does work in Teacher, music. Did have flu shot -Hx of: allergies. Thought at very first could be allergies  ROS-denies fever, SOB, NVD.  Pertinent Past Medical History-  Patient Active Problem List   Diagnosis Date Noted  . Essential hypertension 05/25/2014  . Allergic rhinitis 05/25/2014  . Obesity 05/25/2014    Medications- reviewed  Current Outpatient Prescriptions  Medication Sig Dispense Refill  . amLODipine (NORVASC) 5 MG tablet TAKE 1 TABLET (5 MG TOTAL) BY MOUTH DAILY. 90 tablet 0  . fluticasone (FLONASE) 50 MCG/ACT nasal spray Place 2 sprays into both nostrils daily. 16 g 11  . loratadine (CLARITIN) 10 MG tablet Take 10 mg by mouth daily as needed. For seasonal allergies    . doxycycline (VIBRA-TABS) 100 MG tablet Take 1 tablet (100 mg total) by mouth 2 (two) times daily. 14 tablet 0  . guaiFENesin-codeine 100-10 MG/5ML syrup Take 5 mLs by mouth every 6 (six) hours as needed for cough. 120 mL 0  . predniSONE (DELTASONE) 20 MG tablet Take 1 tablet by mouth daily for 5 days, then 1/2 tablet daily for 2 days 6 tablet 0   No current facility-administered medications for this visit.     Objective: BP 140/86 (BP Location: Left Arm, Patient Position: Sitting, Cuff Size: Large)   Pulse 90   Temp 99.4 F (37.4 C) (Oral)   Wt 204 lb 4 oz (92.6 kg)   LMP 02/27/2012   SpO2 98%   BMI 35.06 kg/m  Gen: NAD, resting comfortably HEENT: Turbinates erythematous with yellow/clear drainage, TM normal, pharynx mildly erythematous with no tonsilar  exudate or edema, bilateral L>R maxillary sinus tenderness CV: RRR no murmurs rubs or gallops Lungs: CTAB no crackles, wheeze, rhonchi Ext: no edema Skin: warm, dry, no rash Neuro: grossly normal, moves all extremities  Assessment/Plan:  Sinsusitis Viral based on <10 days though I am concerned because she has worsened in last few days (could be double sickening). THere could be an allergic element here- will trial prednisone which would also help symptomatically from viral sinusitis. If she is not making progress in right direction in next 48-72 hours then Ms. Kanner will take doxycycline antibiotic. In meantime she will also use codeine cough syrup (cannot drive for 8 hours after taking and should drink with full glass of water). We discussed chance this could be influenza (mild body aches) but with sinus symptoms and cough being most prominent symptoms likely not.   Finally, we reviewed reasons to return to care including if symptoms worsen or persist or new concerns arise (particularly fever or shortness of breath or very dizzy)  S: controlled poorly on no rx as ran out.  BP Readings from Last 3 Encounters:  06/28/16 140/86  10/25/14 132/90  05/25/14 (!) 152/88  A/P: refilled amlodipine and encouraged follow up with Dr. Okey Dupre in next 1-2 montsh to makes ure back in normal range  Meds ordered this encounter  Medications  . amLODipine (NORVASC) 5 MG tablet    Sig: TAKE 1 TABLET (5 MG TOTAL) BY MOUTH DAILY.    Dispense:  90 tablet    Refill:  0  . guaiFENesin-codeine 100-10 MG/5ML syrup    Sig: Take 5 mLs by mouth every 6 (six) hours as needed for cough.    Dispense:  120 mL    Refill:  0  . predniSONE (DELTASONE) 20 MG tablet    Sig: Take 1 tablet by mouth daily for 5 days, then 1/2 tablet daily for 2 days    Dispense:  6 tablet    Refill:  0  . doxycycline (VIBRA-TABS) 100 MG tablet    Sig: Take 1 tablet (100 mg total) by mouth 2 (two) times daily.    Dispense:  14 tablet     Refill:  0    Tana Conch, MD

## 2016-06-28 NOTE — Addendum Note (Signed)
Addended by: Shelva Majestic on: 06/28/2016 09:20 AM   Modules accepted: Level of Service

## 2016-06-30 ENCOUNTER — Telehealth: Payer: Self-pay | Admitting: Internal Medicine

## 2016-06-30 NOTE — Telephone Encounter (Signed)
Can be picked up.

## 2016-06-30 NOTE — Telephone Encounter (Signed)
Patient was seen by Dr. Durene Cal on [facility].  She forgot to get a note for work.  She is requesting a note to excuse her from work for today 4/9.

## 2016-06-30 NOTE — Telephone Encounter (Signed)
LVM with patient stating letter is ready up front

## 2016-07-25 ENCOUNTER — Other Ambulatory Visit: Payer: Self-pay | Admitting: *Deleted

## 2016-07-25 NOTE — Telephone Encounter (Signed)
Left msg on triage requesting refill on her flonase.

## 2016-09-23 ENCOUNTER — Other Ambulatory Visit: Payer: Self-pay | Admitting: Family Medicine

## 2017-04-17 ENCOUNTER — Other Ambulatory Visit: Payer: Self-pay | Admitting: Obstetrics

## 2017-04-17 DIAGNOSIS — B373 Candidiasis of vulva and vagina: Secondary | ICD-10-CM

## 2017-04-17 DIAGNOSIS — B3731 Acute candidiasis of vulva and vagina: Secondary | ICD-10-CM

## 2017-04-17 MED ORDER — FLUCONAZOLE 150 MG PO TABS: 150.0000 mg | ORAL_TABLET | Freq: Once | ORAL | 2 refills | Status: AC

## 2017-05-07 ENCOUNTER — Other Ambulatory Visit: Payer: Self-pay | Admitting: Obstetrics

## 2017-05-07 DIAGNOSIS — R059 Cough, unspecified: Secondary | ICD-10-CM

## 2017-05-07 DIAGNOSIS — R05 Cough: Secondary | ICD-10-CM

## 2017-05-07 MED ORDER — GUAIFENESIN-CODEINE 100-10 MG/5ML PO SOLN: 5.0000 mL | Freq: Four times a day (QID) | ORAL | 0 refills | Status: DC | PRN

## 2017-10-12 ENCOUNTER — Other Ambulatory Visit: Payer: Self-pay | Admitting: Obstetrics

## 2017-10-12 DIAGNOSIS — L309 Dermatitis, unspecified: Secondary | ICD-10-CM

## 2017-10-12 MED ORDER — TRIAMCINOLONE ACETONIDE 0.1 % EX OINT: 1.0000 "application " | TOPICAL_OINTMENT | Freq: Two times a day (BID) | CUTANEOUS | 2 refills | Status: DC

## 2017-10-30 ENCOUNTER — Other Ambulatory Visit: Payer: Self-pay | Admitting: Obstetrics

## 2017-10-30 DIAGNOSIS — B369 Superficial mycosis, unspecified: Secondary | ICD-10-CM

## 2017-10-30 MED ORDER — CLOTRIMAZOLE-BETAMETHASONE 1-0.05 % EX CREA: 1.0000 "application " | TOPICAL_CREAM | Freq: Two times a day (BID) | CUTANEOUS | 0 refills | Status: DC

## 2017-11-02 ENCOUNTER — Ambulatory Visit (INDEPENDENT_AMBULATORY_CARE_PROVIDER_SITE_OTHER): Payer: Self-pay | Admitting: Internal Medicine

## 2017-11-02 ENCOUNTER — Encounter: Payer: Self-pay | Admitting: Internal Medicine

## 2017-11-02 DIAGNOSIS — R21 Rash and other nonspecific skin eruption: Secondary | ICD-10-CM

## 2017-11-02 MED ORDER — AMLODIPINE BESYLATE 5 MG PO TABS: ORAL_TABLET | ORAL | 0 refills | Status: DC

## 2017-11-02 MED ORDER — PREDNISONE 20 MG PO TABS: 40.0000 mg | ORAL_TABLET | Freq: Every day | ORAL | 0 refills | Status: DC

## 2017-11-02 NOTE — Assessment & Plan Note (Signed)
Suspect contact dermatitis and rx for prednisone 4 day burst.

## 2017-11-02 NOTE — Progress Notes (Signed)
   Subjective:    Patient ID: Carol Giles, female    DOB: 09/24/1965, 52 y.o.   MRN: [id number]  HPI The patient is a 52 YO female coming in for rash. She has gotten medicine from her ob/gyn without them seeing it. Started 1-2 weeks ago in the bikini area. This did heal some from the cream. She is now having rash on her arms and legs. Was doing some outdoors work in the last 1-2 weeks. She denies working with poison ivy or oak. She denies fevers or chills. Rash not on face, stomach, back.   Review of Systems  Constitutional: Negative.   HENT: Negative.   Eyes: Negative.   Respiratory: Negative for cough, chest tightness and shortness of breath.   Cardiovascular: Negative for chest pain, palpitations and leg swelling.  Gastrointestinal: Negative for abdominal distention, abdominal pain, constipation, diarrhea, nausea and vomiting.  Musculoskeletal: Negative.   Skin: Positive for rash.  Neurological: Negative.   Psychiatric/Behavioral: Negative.       Objective:   Physical Exam  Constitutional: She is oriented to person, place, and time. She appears well-developed and well-nourished.  HENT:  Head: Normocephalic and atraumatic.  Eyes: EOM are normal.  Neck: Normal range of motion.  Cardiovascular: Normal rate and regular rhythm.  Pulmonary/Chest: Effort normal and breath sounds normal. No respiratory distress. She has no wheezes. She has no rales.  Abdominal: Soft. Bowel sounds are normal. She exhibits no distension. There is no tenderness. There is no rebound.  Musculoskeletal: She exhibits no edema.  Neurological: She is alert and oriented to person, place, and time. Coordination normal.  Skin: Skin is warm and dry.  Rash on the arms and legs with redness and some macular patches   Vitals:   11/02/17 1019  BP: 140/90  Pulse: 84  Temp: 98.4 F (36.9 C)  TempSrc: Oral  SpO2: 99%  Weight: 205 lb (93 kg)  Height: 5\' 4"  (1.626 m)      Assessment & Plan:

## 2017-11-02 NOTE — Patient Instructions (Signed)
We have sent in prednisone to take 2 pills daily for 4 days.   Use the cream you have on the arms or legs to help it heal faster.   You can use benadryl over the counter to help with itching.

## 2017-11-05 ENCOUNTER — Telehealth: Payer: Self-pay | Admitting: Internal Medicine

## 2017-11-05 DIAGNOSIS — L309 Dermatitis, unspecified: Secondary | ICD-10-CM

## 2017-11-05 MED ORDER — TRIAMCINOLONE ACETONIDE 0.1 % EX OINT: 1.0000 "application " | TOPICAL_OINTMENT | Freq: Two times a day (BID) | CUTANEOUS | 0 refills | Status: DC

## 2017-11-05 NOTE — Telephone Encounter (Signed)
Called patient and LVM to call back to go over MD response

## 2017-11-05 NOTE — Telephone Encounter (Signed)
Copied from CRM [phone]. Topic: General - Other >> Nov 05, 2017  9:54 AM Maia Pettiesrtiz, Kristie S wrote: Reason for CRM: pt was seen 11/02/17 for whelps/itching. Pt took last dose of prednisone this morning. Pt states very little improvement since Monday. Pt requesting call back for next steps.

## 2017-11-05 NOTE — Telephone Encounter (Signed)
LVM with MD response  

## 2017-11-05 NOTE — Telephone Encounter (Signed)
We have sent in triamcinolone cream to use on the rash twice a day. Give it about 1 week to continue clearing. Can take benadryl oral for itching or use the benadryl cream during the day for itching.

## 2017-11-05 NOTE — Telephone Encounter (Signed)
Basically like it was the other day- but little less red. "tad bit better" Patient is using benadryl and hydrocortisone cream. Last dose of prednisone was yesterday. Itching is still present- OTC is helping some.

## 2017-11-05 NOTE — Telephone Encounter (Signed)
OTC what? Please be clear in messages

## 2017-11-05 NOTE — Telephone Encounter (Signed)
Is she still having rash/itching or both? Has she tried the benadryl (did this help)?

## 2017-11-05 NOTE — Telephone Encounter (Signed)
Benadryl and hydrocortisone cream ( OTC) are helping some with the itching.

## 2017-11-24 ENCOUNTER — Other Ambulatory Visit: Payer: Self-pay | Admitting: Obstetrics

## 2017-11-24 DIAGNOSIS — J0111 Acute recurrent frontal sinusitis: Secondary | ICD-10-CM

## 2017-11-24 MED ORDER — AZITHROMYCIN 250 MG PO TABS: ORAL_TABLET | ORAL | 2 refills | Status: DC

## 2018-05-14 ENCOUNTER — Other Ambulatory Visit: Payer: Self-pay | Admitting: Obstetrics & Gynecology

## 2018-05-14 ENCOUNTER — Ambulatory Visit
Admission: RE | Admit: 2018-05-14 | Discharge: 2018-05-14 | Disposition: A | Discharge status: Home / Self Care | Payer: PRIVATE HEALTH INSURANCE | Source: Ambulatory Visit | Attending: Obstetrics & Gynecology | Admitting: Obstetrics & Gynecology

## 2018-05-14 DIAGNOSIS — Z1231 Encounter for screening mammogram for malignant neoplasm of breast: Secondary | ICD-10-CM

## 2018-07-24 ENCOUNTER — Other Ambulatory Visit: Payer: Self-pay | Admitting: Obstetrics

## 2018-07-24 DIAGNOSIS — B373 Candidiasis of vulva and vagina: Secondary | ICD-10-CM

## 2018-07-24 DIAGNOSIS — B3731 Acute candidiasis of vulva and vagina: Secondary | ICD-10-CM

## 2018-07-24 MED ORDER — FLUCONAZOLE 150 MG PO TABS: 150.0000 mg | ORAL_TABLET | Freq: Once | ORAL | 2 refills | Status: DC

## 2018-10-24 ENCOUNTER — Other Ambulatory Visit: Payer: Self-pay | Admitting: Obstetrics

## 2018-10-24 DIAGNOSIS — L309 Dermatitis, unspecified: Secondary | ICD-10-CM

## 2018-11-30 ENCOUNTER — Telehealth: Payer: Self-pay | Admitting: *Deleted

## 2018-11-30 NOTE — Telephone Encounter (Signed)
Refill request from pharmacy for  Triamcinolone 0.1% ointment.   Rx was sent in August with no refills.  Does our office need to continue this Rx management or should pt follow up with PCP to continue refills since Dx is Eczema.   Please advise.

## 2018-12-01 ENCOUNTER — Other Ambulatory Visit: Payer: Self-pay | Admitting: *Deleted

## 2018-12-01 DIAGNOSIS — L309 Dermatitis, unspecified: Secondary | ICD-10-CM

## 2018-12-01 MED ORDER — TRIAMCINOLONE ACETONIDE 0.1 % EX OINT: TOPICAL_OINTMENT | Freq: Two times a day (BID) | CUTANEOUS | 0 refills | Status: AC

## 2018-12-01 NOTE — Progress Notes (Signed)
Refill for Triamcinolone sent per Dr Jodi Mourning approval. Pt will need to follow up with PCP or Dermatology to continue refills.

## 2018-12-01 NOTE — Telephone Encounter (Signed)
OK for refill.  She needs referral to Dermatologist.

## 2019-01-10 ENCOUNTER — Other Ambulatory Visit: Payer: Self-pay | Admitting: Internal Medicine

## 2019-01-10 MED ORDER — AMLODIPINE BESYLATE 5 MG PO TABS: ORAL_TABLET | ORAL | 0 refills | Status: DC

## 2019-01-10 NOTE — Telephone Encounter (Signed)
Courtesy refill, pt has appt 01/28/2019

## 2019-01-10 NOTE — Telephone Encounter (Signed)
Patient would like a refill on her amLODipine (NORVASC) 5 MG tablet medication and have it sent to her preferred pharmacy [facility] on [address] until her physical on 01/28/2019.

## 2019-01-13 ENCOUNTER — Other Ambulatory Visit: Payer: Self-pay | Admitting: Obstetrics

## 2019-01-13 DIAGNOSIS — J0111 Acute recurrent frontal sinusitis: Secondary | ICD-10-CM

## 2019-01-28 ENCOUNTER — Other Ambulatory Visit: Payer: Self-pay

## 2019-01-28 ENCOUNTER — Encounter: Payer: Self-pay | Admitting: Internal Medicine

## 2019-01-28 ENCOUNTER — Other Ambulatory Visit (INDEPENDENT_AMBULATORY_CARE_PROVIDER_SITE_OTHER): Payer: PRIVATE HEALTH INSURANCE

## 2019-01-28 ENCOUNTER — Ambulatory Visit (INDEPENDENT_AMBULATORY_CARE_PROVIDER_SITE_OTHER): Payer: PRIVATE HEALTH INSURANCE | Admitting: Internal Medicine

## 2019-01-28 VITALS — BP 130/80 | HR 71 | Temp 98.5°F | Ht 64.0 in | Wt 205.0 lb

## 2019-01-28 DIAGNOSIS — Z0000 Encounter for general adult medical examination without abnormal findings: Secondary | ICD-10-CM

## 2019-01-28 DIAGNOSIS — E6609 Other obesity due to excess calories: Secondary | ICD-10-CM | POA: Diagnosis not present

## 2019-01-28 DIAGNOSIS — J3089 Other allergic rhinitis: Secondary | ICD-10-CM | POA: Diagnosis not present

## 2019-01-28 DIAGNOSIS — I10 Essential (primary) hypertension: Secondary | ICD-10-CM | POA: Diagnosis not present

## 2019-01-28 DIAGNOSIS — Z23 Encounter for immunization: Secondary | ICD-10-CM

## 2019-01-28 DIAGNOSIS — Z6835 Body mass index (BMI) 35.0-35.9, adult: Secondary | ICD-10-CM

## 2019-01-28 LAB — COMPREHENSIVE METABOLIC PANEL
ALT: 21 U/L (ref 0–35)
AST: 15 U/L (ref 0–37)
Albumin: 4.3 g/dL (ref 3.5–5.2)
Alkaline Phosphatase: 95 U/L (ref 39–117)
BUN: 8 mg/dL (ref 6–23)
CO2: 28 mEq/L (ref 19–32)
Calcium: 8.9 mg/dL (ref 8.4–10.5)
Chloride: 106 mEq/L (ref 96–112)
Creatinine, Ser: 0.71 mg/dL (ref 0.40–1.20)
GFR: 103.93 mL/min (ref >60.00)
Glucose, Bld: 100 mg/dL — ABNORMAL HIGH (ref 70–99)
Potassium: 3.7 mEq/L (ref 3.5–5.1)
Sodium: 142 mEq/L (ref 135–145)
Total Bilirubin: 0.6 mg/dL (ref 0.2–1.2)
Total Protein: 6.9 g/dL (ref 6.0–8.3)

## 2019-01-28 LAB — LIPID PANEL
Cholesterol: 192 mg/dL (ref 0–200)
HDL: 57.3 mg/dL (ref >39.00)
LDL Cholesterol: 104 mg/dL — ABNORMAL HIGH (ref 0–99)
NonHDL: 134.33
Total CHOL/HDL Ratio: 3
Triglycerides: 153 mg/dL — ABNORMAL HIGH (ref 0.0–149.0)
VLDL: 30.6 mg/dL (ref 0.0–40.0)

## 2019-01-28 LAB — CBC
HCT: 39.2 % (ref 36.0–46.0)
Hemoglobin: 13.2 g/dL (ref 12.0–15.0)
MCHC: 33.7 g/dL (ref 30.0–36.0)
MCV: 88.9 fl (ref 78.0–100.0)
Platelets: 289 10*3/uL (ref 150.0–400.0)
RBC: 4.41 Mil/uL (ref 3.87–5.11)
RDW: 13.4 % (ref 11.5–15.5)
WBC: 6 10*3/uL (ref 4.0–10.5)

## 2019-01-28 LAB — HEMOGLOBIN A1C: Hgb A1c MFr Bld: 5.6 % (ref 4.6–6.5)

## 2019-01-28 MED ORDER — AMLODIPINE BESYLATE 5 MG PO TABS: ORAL_TABLET | ORAL | 3 refills | Status: DC

## 2019-01-28 MED ORDER — FLUTICASONE PROPIONATE 50 MCG/ACT NA SUSP: 2.0000 | Freq: Every day | NASAL | 3 refills | Status: AC

## 2019-01-28 NOTE — Assessment & Plan Note (Signed)
Weight stable and counseled.

## 2019-01-28 NOTE — Progress Notes (Signed)
   Subjective:   Patient ID: Carol Giles, female    DOB: [birth year], 53 y.o.   MRN: [id number]  HPI The patient is a 53 YO female coming in for physical.   PMH, [facility], social history reviewed and updated.   Review of Systems  Constitutional: Negative.   HENT: Negative.   Eyes: Negative.   Respiratory: Negative for cough, chest tightness and shortness of breath.   Cardiovascular: Negative for chest pain, palpitations and leg swelling.  Gastrointestinal: Negative for abdominal distention, abdominal pain, constipation, diarrhea, nausea and vomiting.  Musculoskeletal: Negative.   Skin: Negative.   Neurological: Negative.   Psychiatric/Behavioral: Negative.     Objective:  Physical Exam Constitutional:      Appearance: She is well-developed. She is obese.  HENT:     Head: Normocephalic and atraumatic.  Neck:     Musculoskeletal: Normal range of motion.  Cardiovascular:     Rate and Rhythm: Normal rate and regular rhythm.  Pulmonary:     Effort: Pulmonary effort is normal. No respiratory distress.     Breath sounds: Normal breath sounds. No wheezing or rales.  Abdominal:     General: Bowel sounds are normal. There is no distension.     Palpations: Abdomen is soft.     Tenderness: There is no abdominal tenderness. There is no rebound.  Skin:    General: Skin is warm and dry.  Neurological:     Mental Status: She is alert and oriented to person, place, and time.     Coordination: Coordination normal.     Vitals:   01/28/19 0752  BP: 130/80  Pulse: 71  Temp: 98.5 F (36.9 C)  TempSrc: Oral  SpO2: 99%  Weight: 205 lb (93 kg)  Height: 5\' 4"  (1.626 m)    Assessment & Plan:  Shingrix IM given at visit

## 2019-01-28 NOTE — Assessment & Plan Note (Signed)
Flu shot up to date. Shingrix given 1st today. Tetanus up to date. Colonoscopy referral to GI. Mammogram up to date, pap smear not indicated. Counseled about sun safety and mole surveillance. Counseled about the dangers of distracted driving. Given 10 year screening recommendations.

## 2019-01-28 NOTE — Assessment & Plan Note (Signed)
Refill flonase

## 2019-01-28 NOTE — Patient Instructions (Signed)
Health Maintenance, Female Adopting a healthy lifestyle and getting preventive care are important in promoting health and wellness. Ask your health care provider about:  The right schedule for you to have regular tests and exams.  Things you can do on your own to prevent diseases and keep yourself healthy. What should I know about diet, weight, and exercise? Eat a healthy diet   Eat a diet that includes plenty of vegetables, fruits, low-fat dairy products, and lean protein.  Do not eat a lot of foods that are high in solid fats, added sugars, or sodium. Maintain a healthy weight Body mass index (BMI) is used to identify weight problems. It estimates body fat based on height and weight. Your health care provider can help determine your BMI and help you achieve or maintain a healthy weight. Get regular exercise Get regular exercise. This is one of the most important things you can do for your health. Most adults should:  Exercise for at least 150 minutes each week. The exercise should increase your heart rate and make you sweat (moderate-intensity exercise).  Do strengthening exercises at least twice a week. This is in addition to the moderate-intensity exercise.  Spend less time sitting. Even light physical activity can be beneficial. Watch cholesterol and blood lipids Have your blood tested for lipids and cholesterol at 53 years of age, then have this test every 5 years. Have your cholesterol levels checked more often if:  Your lipid or cholesterol levels are high.  You are older than 53 years of age.  You are at high risk for heart disease. What should I know about cancer screening? Depending on your health history and family history, you may need to have cancer screening at various ages. This may include screening for:  Breast cancer.  Cervical cancer.  Colorectal cancer.  Skin cancer.  Lung cancer. What should I know about heart disease, diabetes, and high blood  pressure? Blood pressure and heart disease  High blood pressure causes heart disease and increases the risk of stroke. This is more likely to develop in people who have high blood pressure readings, are of African descent, or are overweight.  Have your blood pressure checked: ? Every 3-5 years if you are 18-39 years of age. ? Every year if you are 40 years old or older. Diabetes Have regular diabetes screenings. This checks your fasting blood sugar level. Have the screening done:  Once every three years after age 40 if you are at a normal weight and have a low risk for diabetes.  More often and at a younger age if you are overweight or have a high risk for diabetes. What should I know about preventing infection? Hepatitis B If you have a higher risk for hepatitis B, you should be screened for this virus. Talk with your health care provider to find out if you are at risk for hepatitis B infection. Hepatitis C Testing is recommended for:  Everyone born from 1945 through 1965.  Anyone with known risk factors for hepatitis C. Sexually transmitted infections (STIs)  Get screened for STIs, including gonorrhea and chlamydia, if: ? You are sexually active and are younger than 53 years of age. ? You are older than 53 years of age and your health care provider tells you that you are at risk for this type of infection. ? Your sexual activity has changed since you were last screened, and you are at increased risk for chlamydia or gonorrhea. Ask your health care provider if   you are at risk.  Ask your health care provider about whether you are at high risk for HIV. Your health care provider may recommend a prescription medicine to help prevent HIV infection. If you choose to take medicine to prevent HIV, you should first get tested for HIV. You should then be tested every 3 months for as long as you are taking the medicine. Pregnancy  If you are about to stop having your period (premenopausal) and  you may become pregnant, seek counseling before you get pregnant.  Take 400 to 800 micrograms (mcg) of folic acid every day if you become pregnant.  Ask for birth control (contraception) if you want to prevent pregnancy. Osteoporosis and menopause Osteoporosis is a disease in which the bones lose minerals and strength with aging. This can result in bone fractures. If you are 65 years old or older, or if you are at risk for osteoporosis and fractures, ask your health care provider if you should:  Be screened for bone loss.  Take a calcium or vitamin D supplement to lower your risk of fractures.  Be given hormone replacement therapy (HRT) to treat symptoms of menopause. Follow these instructions at home: Lifestyle  Do not use any products that contain nicotine or tobacco, such as cigarettes, e-cigarettes, and chewing tobacco. If you need help quitting, ask your health care provider.  Do not use street drugs.  Do not share needles.  Ask your health care provider for help if you need support or information about quitting drugs. Alcohol use  Do not drink alcohol if: ? Your health care provider tells you not to drink. ? You are pregnant, may be pregnant, or are planning to become pregnant.  If you drink alcohol: ? Limit how much you use to 0-1 drink a day. ? Limit intake if you are breastfeeding.  Be aware of how much alcohol is in your drink. In the [place] one drink equals one 12 oz bottle of beer (355 mL), one 5 oz glass of wine (148 mL), or one 1 oz glass of hard liquor (44 mL). General instructions  Schedule regular health, dental, and eye exams.  Stay current with your vaccines.  Tell your health care provider if: ? You often feel depressed. ? You have ever been abused or do not feel safe at home. Summary  Adopting a healthy lifestyle and getting preventive care are important in promoting health and wellness.  Follow your health care provider's instructions about healthy  diet, exercising, and getting tested or screened for diseases.  Follow your health care provider's instructions on monitoring your cholesterol and blood pressure. This information is not intended to replace advice given to you by your health care provider. Make sure you discuss any questions you have with your health care provider. Document Released: 09/23/2010 Document Revised: 03/03/2018 Document Reviewed: 03/03/2018 Elsevier Patient Education  2020 Elsevier Inc.  

## 2019-01-28 NOTE — Assessment & Plan Note (Signed)
BP at goal on amlodipine, checking CMP and adjust as needed. 

## 2019-02-10 ENCOUNTER — Other Ambulatory Visit: Payer: Self-pay | Admitting: Internal Medicine

## 2019-04-01 ENCOUNTER — Ambulatory Visit (INDEPENDENT_AMBULATORY_CARE_PROVIDER_SITE_OTHER): Payer: Managed Care, Other (non HMO)

## 2019-04-01 ENCOUNTER — Other Ambulatory Visit: Payer: Self-pay

## 2019-04-01 DIAGNOSIS — Z23 Encounter for immunization: Secondary | ICD-10-CM

## 2019-04-01 DIAGNOSIS — Z299 Encounter for prophylactic measures, unspecified: Secondary | ICD-10-CM

## 2019-04-07 ENCOUNTER — Ambulatory Visit: Payer: Managed Care, Other (non HMO) | Attending: Internal Medicine

## 2019-04-07 DIAGNOSIS — Z20822 Contact with and (suspected) exposure to covid-19: Secondary | ICD-10-CM

## 2019-04-08 LAB — NOVEL CORONAVIRUS, NAA: SARS-CoV-2, NAA: DETECTED — AB

## 2019-04-09 ENCOUNTER — Telehealth: Payer: Self-pay | Admitting: Physician Assistant

## 2019-04-09 NOTE — Telephone Encounter (Signed)
Called to discuss with Carol Giles about Covid symptoms and the use of bamlanivimab, a monoclonal antibody infusion for those with mild to moderate Covid symptoms and at a high risk of hospitalization.     Pt does not qualify for infusion therapy as she has asymptomatic infection. Isolation precautions discussed. Advised to contact back for consideration should she develop symptoms. Hotline number given. Patient verbalized understanding.      Patient Active Problem List   Diagnosis Date Noted  . Routine general medical examination at a health care facility 01/28/2019  . Essential hypertension 05/25/2014  . Allergic rhinitis 05/25/2014  . Obesity 05/25/2014     Manson Passey, PA  - C

## 2019-04-11 ENCOUNTER — Encounter: Payer: Self-pay | Admitting: *Deleted

## 2019-04-15 ENCOUNTER — Other Ambulatory Visit: Payer: Self-pay | Admitting: Family

## 2019-04-15 ENCOUNTER — Ambulatory Visit (INDEPENDENT_AMBULATORY_CARE_PROVIDER_SITE_OTHER): Payer: Managed Care, Other (non HMO) | Admitting: Family

## 2019-04-15 ENCOUNTER — Encounter: Payer: Self-pay | Admitting: Family

## 2019-04-15 DIAGNOSIS — U071 COVID-19: Secondary | ICD-10-CM

## 2019-04-15 NOTE — Progress Notes (Signed)
Carol Giles is a 54 y.o. female with the following history as recorded in EpicCare:  Patient Active Problem List   Diagnosis Date Noted  . Routine general medical examination at a health care facility 01/28/2019  . Essential hypertension 05/25/2014  . Allergic rhinitis 05/25/2014  . Obesity 05/25/2014    Current Outpatient Medications  Medication Sig Dispense Refill  . amLODipine (NORVASC) 5 MG tablet TAKE 1 TABLET(5 MG) BY MOUTH DAILY 90 tablet 3  . fluticasone (FLONASE) 50 MCG/ACT nasal spray Place 2 sprays into both nostrils daily. 48 g 3  . loratadine (CLARITIN) 10 MG tablet Take 10 mg by mouth daily as needed. For seasonal allergies    . triamcinolone ointment (KENALOG) 0.1 % Apply topically 2 (two) times daily. to affected area 160 g 0   No current facility-administered medications for this visit.    Allergies: Penicillins  Past Medical History:  Diagnosis Date  . Allergy   . No pertinent past medical history   . Recurrent upper respiratory infection (URI)    abx completed    Past Surgical History:  Procedure Laterality Date  . ABDOMINAL HYSTERECTOMY    . BILATERAL SALPINGECTOMY  03/05/2012   Procedure: BILATERAL SALPINGECTOMY;  Surgeon: Lahoma Crocker, MD;  Location: [facility];  Service: Gynecology;  Laterality: Bilateral;  . BLADDER SUSPENSION  03/03/2011   Procedure: TRANSVAGINAL TAPE (TVT) PROCEDURE;  Surgeon: Agnes Lawrence, MD;  Location: [facility];  Service: Gynecology;  Laterality: Bilateral;  transobturator mid-urethral sling placement   . CYSTOSCOPY  03/03/2011   Procedure: CYSTOSCOPY;  Surgeon: Agnes Lawrence, MD;  Location: [facility];  Service: Gynecology;;  . DILATION AND CURETTAGE OF UTERUS  02/28/05  . NOVASURE ABLATION  02/28/05  . ROBOTIC ASSISTED TOTAL HYSTERECTOMY  03/05/2012   Procedure: ROBOTIC ASSISTED TOTAL HYSTERECTOMY;  Surgeon: Lahoma Crocker, MD;  Location: [facility];  Service: Gynecology;  Laterality: N/A;  . SVD     x 2  . TUBAL  LIGATION  05/05/00  . WISDOM TOOTH EXTRACTION      Family History  Problem Relation Age of Onset  . Hypertension Mother   . Thyroid disease Mother   . Hypertension Father   . Diabetes Father   . Hypertension Brother   . Stroke Maternal Aunt   . Stroke Maternal Uncle   . Stroke Paternal Aunt   . Stroke Paternal Uncle   . Stroke Maternal Grandmother   . Cancer Maternal Grandfather        lung    Social History   Tobacco Use  . Smoking status: Never Smoker  . Smokeless tobacco: Never Used  Substance Use Topics  . Alcohol use: No    Subjective:  Patient was diagnosed with COVID on 04/07/19; she is requesting a note to return to work- she has been fever free and actually symptom free since the diagnosis. Her workplace is comfortable with her returning to work on 1/25. Patient wants a note to give to her employer as well. Has no other concerns today;   Objective:  There were no vitals filed for this visit.  General: Well developed, well nourished, in no acute distress  Head: Normocephalic and atraumatic  Lungs: Respirations unlabored;  Neurologic: Alert and oriented; speech intact;   Assessment:  1. COVID-19 virus detected     Plan:  Patient is doing very well and has no symptoms since time of diagnosis; agree that it is safe for her to return to work on 1/25; note will be  sent to MyChart as requested. Follow-up with her PCP for other symptoms or concerns.   No follow-ups on file.  No orders of the defined types were placed in this encounter.   Requested Prescriptions    No prescriptions requested or ordered in this encounter

## 2019-09-22 ENCOUNTER — Other Ambulatory Visit: Payer: Self-pay | Admitting: Obstetrics

## 2019-09-22 DIAGNOSIS — R21 Rash and other nonspecific skin eruption: Secondary | ICD-10-CM

## 2019-09-22 MED ORDER — CLOTRIMAZOLE-BETAMETHASONE 1-0.05 % EX CREA: 1.0000 "application " | TOPICAL_CREAM | Freq: Two times a day (BID) | CUTANEOUS | 1 refills | Status: AC

## 2019-12-07 ENCOUNTER — Other Ambulatory Visit: Payer: Self-pay | Admitting: Obstetrics

## 2019-12-07 DIAGNOSIS — B3731 Acute candidiasis of vulva and vagina: Secondary | ICD-10-CM

## 2020-02-21 ENCOUNTER — Other Ambulatory Visit: Payer: Self-pay | Admitting: Obstetrics

## 2020-02-21 DIAGNOSIS — J0111 Acute recurrent frontal sinusitis: Secondary | ICD-10-CM

## 2020-05-03 IMAGING — MG DIGITAL SCREENING BILATERAL MAMMOGRAM WITH TOMO AND CAD
8 series · 8 of 24 positions shown · non-contrast
Comparison: Previous exam(s).

CLINICAL DATA: Screening.

EXAM:
DIGITAL SCREENING BILATERAL MAMMOGRAM WITH TOMO AND CAD

[R MLO synth-2D]
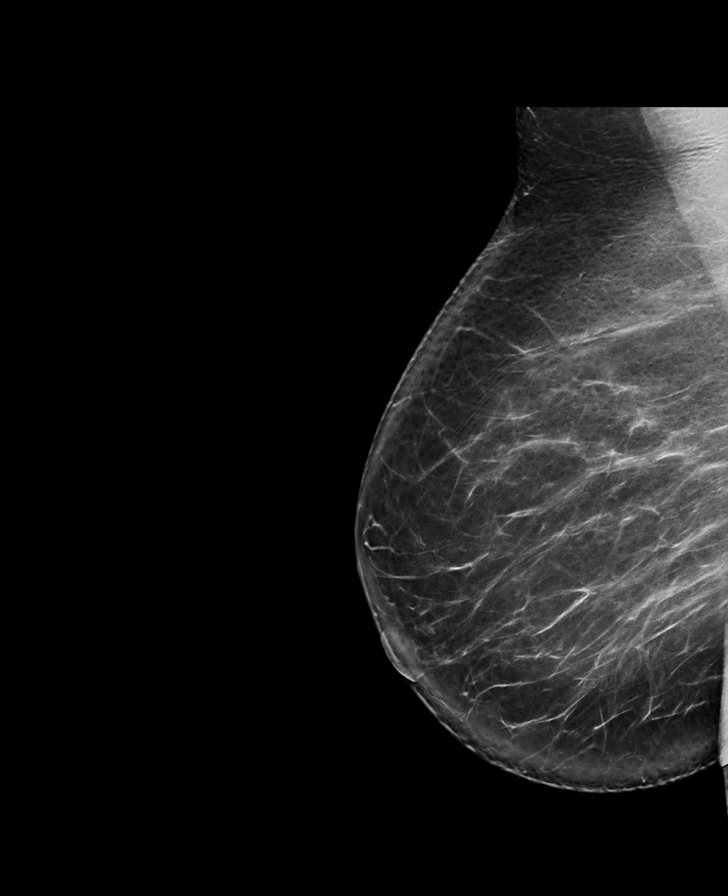

[L CC synth-2D]
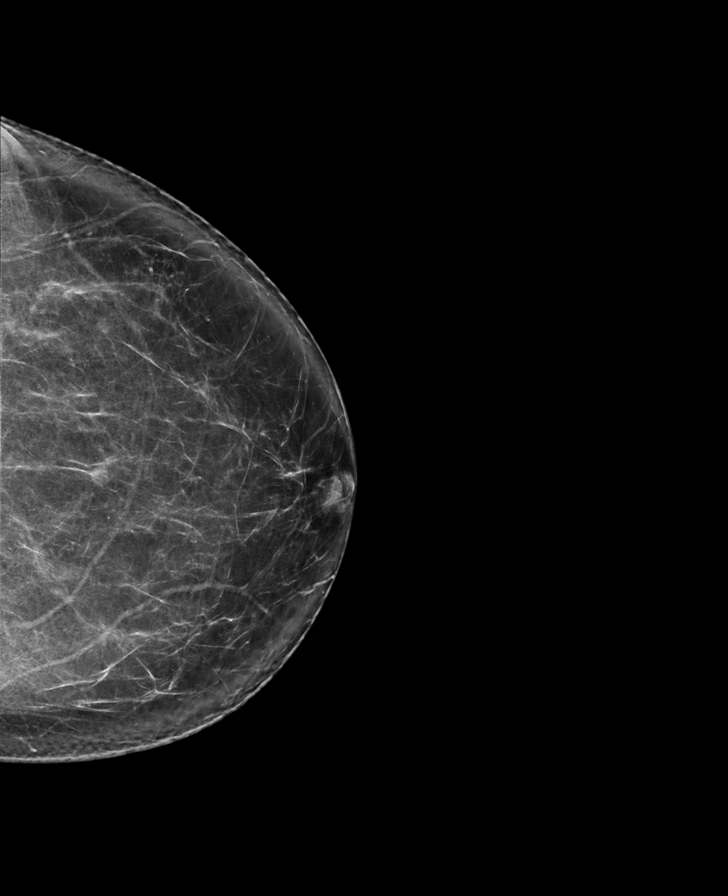

[R CC synth-2D]
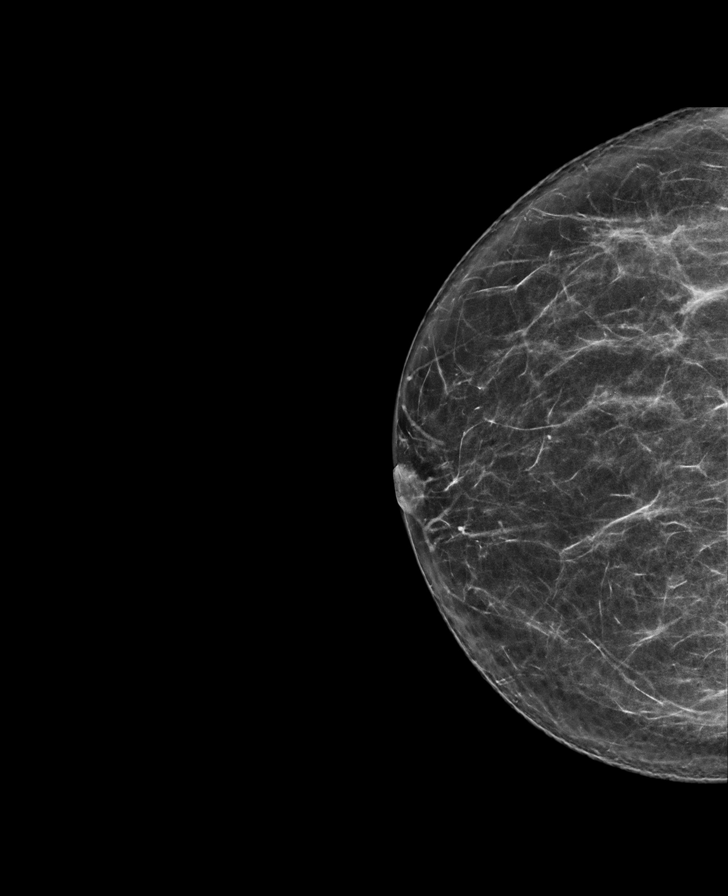

[L MLO synth-2D]
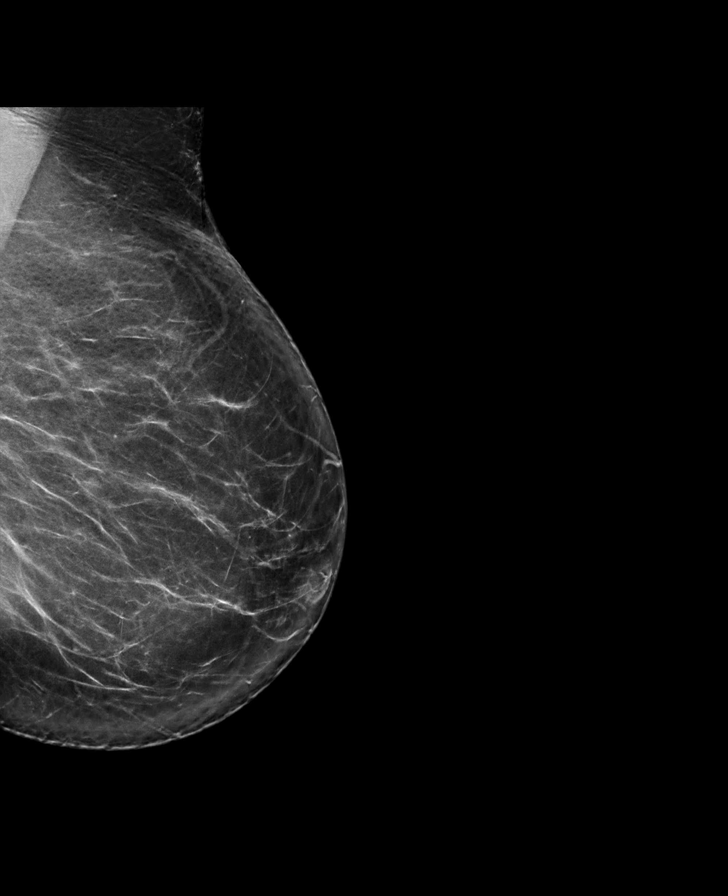

[R MLO tomo · tomo slice 49/96.0]
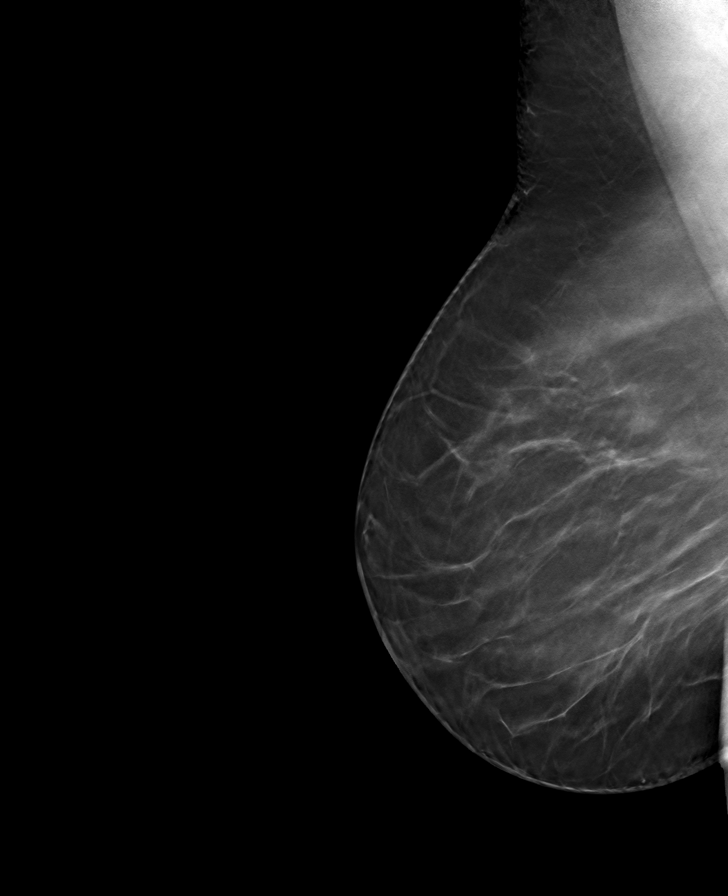

[R CC tomo · tomo slice 37/74.0]
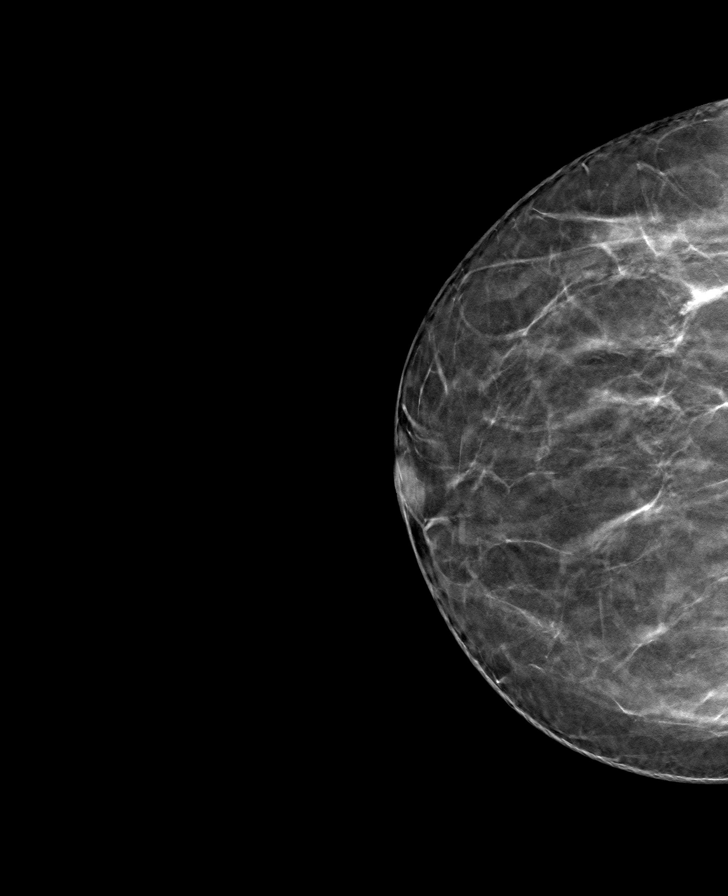

[L CC tomo · tomo slice 43/85.0]
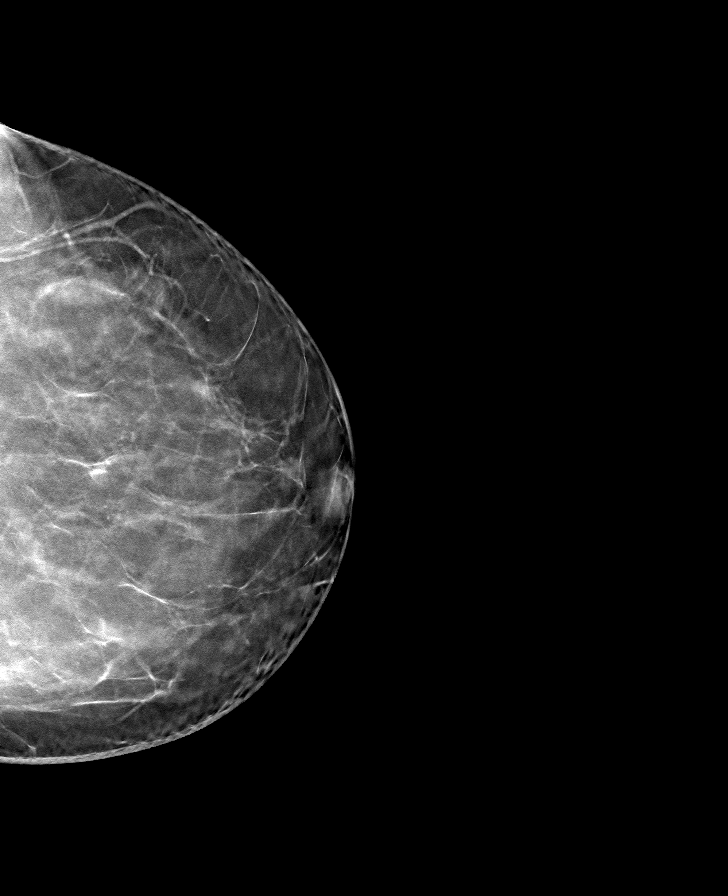

[L MLO tomo · tomo slice 47/94.0]
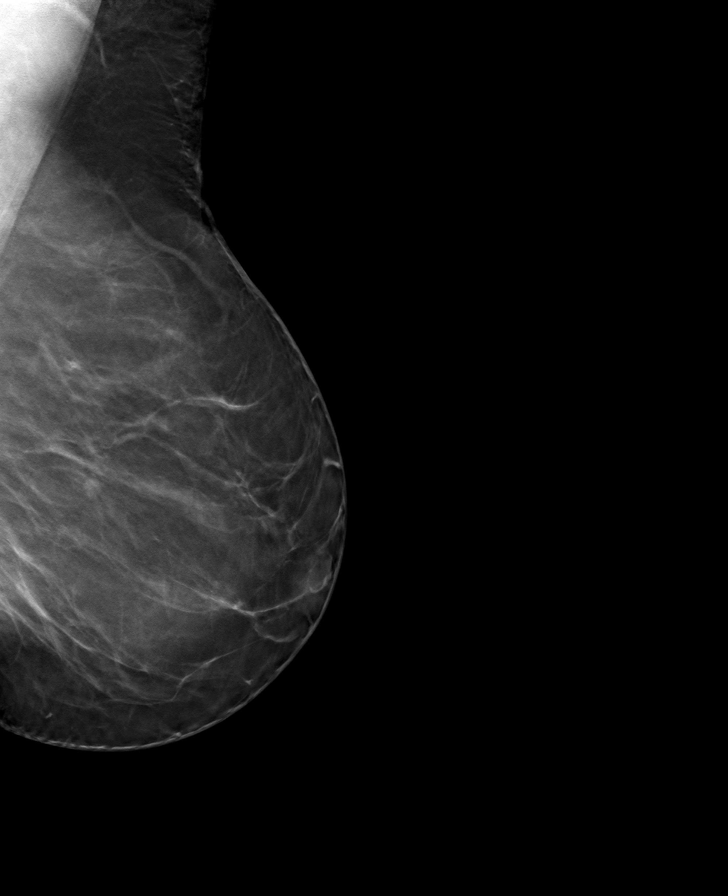

[8 of 24 positions shown; findings below may reference images not displayed]

ACR Breast Density Category b: There are scattered areas of
fibroglandular density.
FINDINGS: There are no findings suspicious for malignancy. Images were
processed with CAD.
IMPRESSION: No mammographic evidence of malignancy. A result letter of this
screening mammogram will be mailed directly to the patient.

RECOMMENDATION:
Screening mammogram in one year. (Code:[id number])

BI-RADS CATEGORY  1: Negative.

## 2020-09-05 ENCOUNTER — Other Ambulatory Visit: Payer: Self-pay | Admitting: Family Medicine

## 2020-09-05 DIAGNOSIS — Z1231 Encounter for screening mammogram for malignant neoplasm of breast: Secondary | ICD-10-CM

## 2020-10-11 ENCOUNTER — Ambulatory Visit
Admission: RE | Admit: 2020-10-11 | Discharge: 2020-10-11 | Disposition: A | Discharge status: Home / Self Care | Payer: Managed Care, Other (non HMO) | Source: Ambulatory Visit | Attending: Sports Medicine | Admitting: Sports Medicine

## 2020-10-11 ENCOUNTER — Other Ambulatory Visit: Payer: Self-pay | Admitting: Sports Medicine

## 2020-10-11 ENCOUNTER — Other Ambulatory Visit: Payer: Self-pay

## 2020-10-11 DIAGNOSIS — M25562 Pain in left knee: Secondary | ICD-10-CM

## 2021-09-26 ENCOUNTER — Other Ambulatory Visit: Payer: Self-pay | Admitting: Family Medicine

## 2021-09-26 DIAGNOSIS — Z1231 Encounter for screening mammogram for malignant neoplasm of breast: Secondary | ICD-10-CM

## 2021-10-10 ENCOUNTER — Ambulatory Visit
Admission: RE | Admit: 2021-10-10 | Discharge: 2021-10-10 | Disposition: A | Discharge status: Home / Self Care | Payer: No Typology Code available for payment source | Source: Ambulatory Visit | Attending: Family Medicine | Admitting: Family Medicine

## 2021-10-10 DIAGNOSIS — Z1231 Encounter for screening mammogram for malignant neoplasm of breast: Secondary | ICD-10-CM

## 2022-01-15 ENCOUNTER — Other Ambulatory Visit: Payer: Self-pay | Admitting: Obstetrics

## 2022-01-15 DIAGNOSIS — N750 Cyst of Bartholin's gland: Secondary | ICD-10-CM

## 2022-01-15 DIAGNOSIS — B3731 Acute candidiasis of vulva and vagina: Secondary | ICD-10-CM

## 2022-01-15 MED ORDER — FLUCONAZOLE 150 MG PO TABS: ORAL_TABLET | ORAL | 2 refills | Status: AC

## 2022-01-15 MED ORDER — AMOXICILLIN-POT CLAVULANATE 500-125 MG PO TABS: 1.0000 | ORAL_TABLET | Freq: Two times a day (BID) | ORAL | 0 refills | Status: DC

## 2022-02-10 ENCOUNTER — Other Ambulatory Visit: Payer: Self-pay

## 2022-02-10 MED ORDER — LEVOFLOXACIN 750 MG PO TABS
750.0000 mg | ORAL_TABLET | Freq: Every day | ORAL | 0 refills | Status: DC
  Filled 2022-02-10: qty 7, 7d supply, fill #0

## 2022-02-16 ENCOUNTER — Emergency Department (HOSPITAL_BASED_OUTPATIENT_CLINIC_OR_DEPARTMENT_OTHER)
Admission: EM | Admit: 2022-02-16 | Discharge: 2022-02-16 | Disposition: A | Discharge status: Home / Self Care | Payer: No Typology Code available for payment source | Attending: Emergency Medicine | Admitting: Emergency Medicine

## 2022-02-16 ENCOUNTER — Emergency Department (HOSPITAL_BASED_OUTPATIENT_CLINIC_OR_DEPARTMENT_OTHER): Payer: No Typology Code available for payment source

## 2022-02-16 ENCOUNTER — Encounter (HOSPITAL_BASED_OUTPATIENT_CLINIC_OR_DEPARTMENT_OTHER): Payer: Self-pay | Admitting: Emergency Medicine

## 2022-02-16 ENCOUNTER — Other Ambulatory Visit: Payer: Self-pay

## 2022-02-16 DIAGNOSIS — J40 Bronchitis, not specified as acute or chronic: Secondary | ICD-10-CM | POA: Insufficient documentation

## 2022-02-16 DIAGNOSIS — R1032 Left lower quadrant pain: Secondary | ICD-10-CM | POA: Insufficient documentation

## 2022-02-16 DIAGNOSIS — Z79899 Other long term (current) drug therapy: Secondary | ICD-10-CM | POA: Insufficient documentation

## 2022-02-16 DIAGNOSIS — T148XXA Other injury of unspecified body region, initial encounter: Secondary | ICD-10-CM

## 2022-02-16 LAB — COMPREHENSIVE METABOLIC PANEL
ALT: 12 U/L (ref 0–44)
AST: 12 U/L — ABNORMAL LOW (ref 15–41)
Albumin: 4.6 g/dL (ref 3.5–5.0)
Alkaline Phosphatase: 89 U/L (ref 38–126)
Anion gap: 10 (ref 5–15)
BUN: 9 mg/dL (ref 6–20)
CO2: 27 mmol/L (ref 22–32)
Calcium: 9.3 mg/dL (ref 8.9–10.3)
Chloride: 105 mmol/L (ref 98–111)
Creatinine, Ser: 0.84 mg/dL (ref 0.44–1.00)
GFR, Estimated: >60 mL/min (ref >60)
Glucose, Bld: 116 mg/dL — ABNORMAL HIGH (ref 70–99)
Potassium: 3.5 mmol/L (ref 3.5–5.1)
Sodium: 142 mmol/L (ref 135–145)
Total Bilirubin: 0.9 mg/dL (ref 0.3–1.2)
Total Protein: 7.6 g/dL (ref 6.5–8.1)

## 2022-02-16 LAB — URINALYSIS, ROUTINE W REFLEX MICROSCOPIC
Bilirubin Urine: NEGATIVE
Glucose, UA: NEGATIVE mg/dL
Hgb urine dipstick: NEGATIVE
Ketones, ur: NEGATIVE mg/dL
Leukocytes,Ua: NEGATIVE
Nitrite: NEGATIVE
Protein, ur: NEGATIVE mg/dL
Specific Gravity, Urine: 1.009 (ref 1.005–1.030)
pH: 7 (ref 5.0–8.0)

## 2022-02-16 LAB — CBC
HCT: 41.4 % (ref 36.0–46.0)
Hemoglobin: 14 g/dL (ref 12.0–15.0)
MCH: 30.1 pg (ref 26.0–34.0)
MCHC: 33.8 g/dL (ref 30.0–36.0)
MCV: 89 fL (ref 80.0–100.0)
Platelets: 354 10*3/uL (ref 150–400)
RBC: 4.65 MIL/uL (ref 3.87–5.11)
RDW: 13.3 % (ref 11.5–15.5)
WBC: 7.1 10*3/uL (ref 4.0–10.5)
nRBC: 0 % (ref 0.0–0.2)

## 2022-02-16 LAB — LIPASE, BLOOD: Lipase: 15 U/L (ref 11–51)

## 2022-02-16 LAB — LACTIC ACID, PLASMA: Lactic Acid, Venous: 1.3 mmol/L (ref 0.5–1.9)

## 2022-02-16 LAB — PREGNANCY, URINE: Preg Test, Ur: NEGATIVE

## 2022-02-16 MED ORDER — IOHEXOL 300 MG/ML  SOLN
100.0000 mL | Freq: Once | INTRAMUSCULAR | Status: AC | PRN
  Administered 2022-02-16: 85 mL via INTRAVENOUS

## 2022-02-16 MED ORDER — FENTANYL CITRATE PF 50 MCG/ML IJ SOSY
50.0000 ug | PREFILLED_SYRINGE | Freq: Once | INTRAMUSCULAR | Status: AC
  Administered 2022-02-16: 50 ug via INTRAVENOUS
  Filled 2022-02-16: qty 1

## 2022-02-16 MED ORDER — LACTATED RINGERS IV BOLUS
1000.0000 mL | Freq: Once | INTRAVENOUS | Status: AC
  Administered 2022-02-16: 1000 mL via INTRAVENOUS

## 2022-02-16 NOTE — ED Triage Notes (Signed)
2 weeks ago, cough/congestion, tx bronchitis with steroid and antibiotic. Left groin started hurting about 4 days ago, wasn't sure if it was from coughing too much, but has worsened and severe this am.

## 2022-02-16 NOTE — ED Provider Notes (Signed)
[facility] Provider Note   CSN: [id number] Arrival date & time: 02/16/22  [id number]     History  Chief Complaint  Patient presents with   Abdominal Pain    Carol Giles is a 56 y.o. female.  HPI   56 year old female with medical history significant for recent treatment of bronchitis with steroid and antibiotic who presents to the emergency department with left sided lower abdomen pain.  She states that her groin and lower quadrant on the left of her abdomen began to hurt about 4 days ago.  She thought it was from coughing too much.  It was severe this morning and so she presented to the emergency department for further evaluation.  She denies any fever or chills, dysuria, nausea, vomiting, diarrhea.  No vaginal bleeding or discharge.  Home Medications Prior to Admission medications   Medication Sig Start Date End Date Taking? Authorizing Provider  amLODipine (NORVASC) 5 MG tablet TAKE 1 TABLET(5 MG) BY MOUTH DAILY 02/10/19   Hoyt Koch, MD  amoxicillin-clavulanate (AUGMENTIN) 500-125 MG tablet Take 1 tablet by mouth 2 (two) times daily. 1 tablet po BID for 7 days. 01/15/22   Shelly Bombard, MD  azithromycin (ZITHROMAX) 250 MG tablet TAKE AS DIRECTED IN PACKAGE 02/22/20   Shelly Bombard, MD  clotrimazole-betamethasone (LOTRISONE) cream Apply 1 application topically 2 (two) times daily. 09/22/19   Shelly Bombard, MD  fluconazole (DIFLUCAN) 150 MG tablet TAKE 1 TABLET(150 MG) BY MOUTH 1 TIME FOR 1 DOSE 01/15/22   Shelly Bombard, MD  fluticasone [facility]) 50 MCG/ACT nasal spray Place 2 sprays into both nostrils daily. 01/28/19   Hoyt Koch, MD  levofloxacin (LEVAQUIN) 750 MG tablet Take 1 tablet (750 mg total) by mouth daily. 02/10/22     loratadine (CLARITIN) 10 MG tablet Take 10 mg by mouth daily as needed. For seasonal allergies    [provider]  triamcinolone ointment (KENALOG) 0.1 % Apply topically 2 (two) times  daily. to affected area 12/01/18   Shelly Bombard, MD      Allergies    Penicillins    Review of Systems   Review of Systems  All other systems reviewed and are negative.   Physical Exam Updated Vital Signs BP (!) 151/78   Pulse (!) 54   Temp 98.4 F (36.9 C) (Oral)   Resp 20   LMP 02/27/2012   SpO2 100%  Physical Exam Vitals and nursing note reviewed.  Constitutional:      General: She is not in acute distress.    Appearance: She is well-developed.  HENT:     Head: Normocephalic and atraumatic.  Eyes:     Conjunctiva/sclera: Conjunctivae normal.  Cardiovascular:     Rate and Rhythm: Normal rate and regular rhythm.     Heart sounds: No murmur heard. Pulmonary:     Effort: Pulmonary effort is normal. No respiratory distress.     Breath sounds: Normal breath sounds.  Abdominal:     Palpations: Abdomen is soft.     Tenderness: There is abdominal tenderness in the left lower quadrant. There is no guarding or rebound.     Comments: No palpable inguinal or femoral hernia  Musculoskeletal:        General: No swelling.     Cervical back: Neck supple.  Skin:    General: Skin is warm and dry.     Capillary Refill: Capillary refill takes less than 2 seconds.  Neurological:  Mental Status: She is alert.  Psychiatric:        Mood and Affect: Mood normal.     ED Results / Procedures / Treatments   Labs (all labs ordered are listed, but only abnormal results are displayed) Labs Reviewed  COMPREHENSIVE METABOLIC PANEL - Abnormal; Notable for the following components:      Result Value   Glucose, Bld 116 (*)    AST 12 (*)    All other components within normal limits  URINALYSIS, ROUTINE W REFLEX MICROSCOPIC - Abnormal; Notable for the following components:   Color, Urine COLORLESS (*)    All other components within normal limits  LIPASE, BLOOD  CBC  PREGNANCY, URINE  LACTIC ACID, PLASMA    EKG None  Radiology CT ABDOMEN PELVIS W CONTRAST  Result Date:  02/16/2022 CLINICAL DATA:  LLQ abdominal pain EXAM: CT ABDOMEN AND PELVIS WITH CONTRAST TECHNIQUE: Multidetector CT imaging of the abdomen and pelvis was performed using the standard protocol following bolus administration of intravenous contrast. RADIATION DOSE REDUCTION: This exam was performed according to the departmental dose-optimization program which includes automated exposure control, adjustment of the mA and/or kV according to patient size and/or use of iterative reconstruction technique. CONTRAST:  99 mL Omnipaque 300non-ionic IV contrast COMPARISON:  None Available. FINDINGS: Lower chest: No acute abnormality. Hepatobiliary: No focal liver abnormality is seen. No gallstones, gallbladder wall thickening, or biliary dilatation. Pancreas: Unremarkable. No pancreatic ductal dilatation or surrounding inflammatory changes. Spleen: Normal in size without focal abnormality. Adrenals/Urinary Tract: Adrenal glands are unremarkable. Kidneys are normal, without renal calculi, focal lesion, or hydronephrosis. Bladder is unremarkable. Stomach/Bowel: Stomach is within normal limits. Appendix appears normal. No evidence of bowel wall thickening, distention, or inflammatory changes. Sigmoid diverticulosis is seen. There is no evidence of diverticulitis. Vascular/Lymphatic: No significant vascular findings are present. No enlarged abdominal or pelvic lymph nodes. Reproductive: There is evidence of prior hysterectomy. Other: No abdominal wall hernia or abnormality. No abdominopelvic ascites. Musculoskeletal: No acute or significant osseous findings. IMPRESSION: 1. Diverticulosis. 2. No acute abdominal or pelvic pathology identified. Electronically Signed   By: Sammie Bench M.D.   On: 02/16/2022 12:31    Procedures Procedures    Medications Ordered in ED Medications  fentaNYL (SUBLIMAZE) injection 50 mcg (50 mcg Intravenous Given 02/16/22 1232)  lactated ringers bolus 1,000 mL (0 mLs Intravenous Stopped  02/16/22 1344)  iohexol (OMNIPAQUE) 300 MG/ML solution 100 mL (85 mLs Intravenous Contrast Given 02/16/22 1223)    ED Course/ Medical Decision Making/ A&P                           Medical Decision Making Amount and/or Complexity of Data Reviewed Labs: ordered. Radiology: ordered.  Risk Prescription drug management.    56 year old female with medical history significant for recent treatment of bronchitis with steroid and antibiotic who presents to the emergency department with left sided lower abdomen pain.  She states that her groin and lower quadrant on the left of her abdomen began to hurt about 4 days ago.  She thought it was from coughing too much.  It was severe this morning and so she presented to the emergency department for further evaluation.  She denies any fever or chills, dysuria, nausea, vomiting, diarrhea.  No vaginal bleeding or discharge.  Arrival, the patient was afebrile, vitally stable, mildly hypertensive BP 167/88, saturating 100% on room air.  Sinus rhythm noted on cardiac telemetry.  Physical exam significant for  left lower quadrant abdominal tenderness, no palpated hernia, no rebound or guarding.  Diagnosis includes diverticulitis, musculoskeletal injury, small bowel obstruction, nephrolithiasis/pyelonephritis, UTI, constipation.  Her evaluation significant for urinalysis without evidence of UTI, lipase normal, CMP unremarkable, CBC unremarkable, lactic acid normal.  CT abdomen pelvis was performed which revealed the following: FINDINGS:  Lower chest: No acute abnormality.    Hepatobiliary: No focal liver abnormality is seen. No gallstones,  gallbladder wall thickening, or biliary dilatation.    Pancreas: Unremarkable. No pancreatic ductal dilatation or  surrounding inflammatory changes.    Spleen: Normal in size without focal abnormality.    Adrenals/Urinary Tract: Adrenal glands are unremarkable. Kidneys are  normal, without renal calculi, focal lesion,  or hydronephrosis.  Bladder is unremarkable.    Stomach/Bowel: Stomach is within normal limits. Appendix appears  normal. No evidence of bowel wall thickening, distention, or  inflammatory changes. Sigmoid diverticulosis is seen. There is no  evidence of diverticulitis.    Vascular/Lymphatic: No significant vascular findings are present. No  enlarged abdominal or pelvic lymph nodes.    Reproductive: There is evidence of prior hysterectomy.    Other: No abdominal wall hernia or abnormality. No abdominopelvic  ascites.    Musculoskeletal: No acute or significant osseous findings.    IMPRESSION:  1. Diverticulosis.  2. No acute abdominal or pelvic pathology identified.    Patient was administered and I the fluid bolus and IV fentanyl with subsequent improvement.  Suspect likely musculoskeletal strain.  No wrapping around or burning sensation or pain with light touch to suggest shingles.  Favor likely musculoskeletal abdominal wall strain in the setting of repeated coughing.  Patient is not hypoxic, overall well-appearing, stable for continued outpatient management at this time.   Final Clinical Impression(s) / ED Diagnoses Final diagnoses:  LLQ abdominal pain  Musculoskeletal strain  Bronchitis    Rx / DC Orders ED Discharge Orders     None         Ernie Avena, MD 02/17/22 1216

## 2022-02-16 NOTE — Discharge Instructions (Addendum)
Your CT imaging and laboratory evaluation was reassuring.  Recommend Tylenol and ibuprofen for pain control.  Your symptoms could be due to musculoskeletal injury in the setting of recurrent coughing due to acute bronchitis.  CT Findings: FINDINGS: Lower chest: No acute abnormality.  Hepatobiliary: No focal liver abnormality is seen. No gallstones, gallbladder wall thickening, or biliary dilatation.  Pancreas: Unremarkable. No pancreatic ductal dilatation or surrounding inflammatory changes.  Spleen: Normal in size without focal abnormality.  Adrenals/Urinary Tract: Adrenal glands are unremarkable. Kidneys are normal, without renal calculi, focal lesion, or hydronephrosis. Bladder is unremarkable.  Stomach/Bowel: Stomach is within normal limits. Appendix appears normal. No evidence of bowel wall thickening, distention, or inflammatory changes. Sigmoid diverticulosis is seen. There is no evidence of diverticulitis.  Vascular/Lymphatic: No significant vascular findings are present. No enlarged abdominal or pelvic lymph nodes.  Reproductive: There is evidence of prior hysterectomy.  Other: No abdominal wall hernia or abnormality. No abdominopelvic ascites.  Musculoskeletal: No acute or significant osseous findings.  IMPRESSION: 1. Diverticulosis. 2. No acute abdominal or pelvic pathology identified.

## 2022-02-17 ENCOUNTER — Telehealth: Payer: Self-pay

## 2022-02-17 NOTE — Telephone Encounter (Signed)
Transition Care Management Unsuccessful Follow-up Telephone Call  Date of discharge and from where:  02/16/2022, [facility]   Attempts:  1st Attempt  Reason for unsuccessful TCM follow-up call:  Left voice message

## 2022-02-20 ENCOUNTER — Telehealth: Payer: Self-pay

## 2022-02-20 NOTE — Telephone Encounter (Signed)
Transition Care Management Unsuccessful Follow-up Telephone Call  Date of discharge and from where:  [facility]   Attempts:  2nd Attempt  Reason for unsuccessful TCM follow-up call:  Left voice message

## 2022-04-15 ENCOUNTER — Other Ambulatory Visit: Payer: Self-pay

## 2022-04-15 MED ORDER — BISACODYL 5 MG PO TBEC
DELAYED_RELEASE_TABLET | ORAL | 0 refills | Status: AC
  Filled 2022-04-15: qty 4, 1d supply, fill #0

## 2022-04-15 MED ORDER — PEG 3350-KCL-NA BICARB-NACL 420 G PO SOLR
ORAL | 0 refills | Status: AC
  Filled 2022-04-15: qty 4000, 1d supply, fill #0

## 2022-04-17 ENCOUNTER — Other Ambulatory Visit: Payer: Self-pay

## 2022-07-18 ENCOUNTER — Other Ambulatory Visit: Payer: Self-pay

## 2022-10-27 ENCOUNTER — Other Ambulatory Visit: Payer: Self-pay

## 2022-10-27 MED ORDER — LOMAIRA 8 MG PO TABS
8.0000 mg | ORAL_TABLET | Freq: Every day | ORAL | 0 refills | Status: DC
  Filled 2022-10-27: qty 30, 30d supply, fill #0

## 2022-10-28 ENCOUNTER — Other Ambulatory Visit: Payer: Self-pay

## 2022-11-10 ENCOUNTER — Other Ambulatory Visit: Payer: Self-pay

## 2022-11-10 MED ORDER — LOMAIRA 8 MG PO TABS
8.0000 mg | ORAL_TABLET | Freq: Every day | ORAL | 0 refills | Status: DC
  Filled 2022-11-10 – 2022-11-26 (×3): qty 30, 30d supply, fill #0

## 2022-11-18 ENCOUNTER — Other Ambulatory Visit: Payer: Self-pay

## 2022-11-18 MED ORDER — TRIAMCINOLONE ACETONIDE 0.1 % EX CREA
1.0000 | TOPICAL_CREAM | Freq: Two times a day (BID) | CUTANEOUS | 1 refills | Status: AC | PRN
  Filled 2022-11-18: qty 60, 30d supply, fill #0

## 2022-11-26 ENCOUNTER — Other Ambulatory Visit: Payer: Self-pay

## 2022-11-27 ENCOUNTER — Other Ambulatory Visit: Payer: Self-pay

## 2022-12-08 ENCOUNTER — Other Ambulatory Visit: Payer: Self-pay

## 2022-12-08 MED ORDER — LOMAIRA 8 MG PO TABS
8.0000 mg | ORAL_TABLET | Freq: Every day | ORAL | 0 refills | Status: AC
  Filled ????-??-??: fill #0

## 2022-12-17 ENCOUNTER — Other Ambulatory Visit: Payer: Self-pay

## 2022-12-24 ENCOUNTER — Other Ambulatory Visit: Payer: Self-pay

## 2023-05-08 ENCOUNTER — Other Ambulatory Visit: Payer: Self-pay | Admitting: Obstetrics

## 2023-05-08 DIAGNOSIS — J0111 Acute recurrent frontal sinusitis: Secondary | ICD-10-CM

## 2023-05-08 MED ORDER — AZITHROMYCIN 250 MG PO TABS: ORAL_TABLET | ORAL | 2 refills | Status: AC

## 2023-10-03 ENCOUNTER — Other Ambulatory Visit: Payer: Self-pay | Admitting: Obstetrics

## 2023-10-03 DIAGNOSIS — M25469 Effusion, unspecified knee: Secondary | ICD-10-CM

## 2023-10-03 MED ORDER — PREDNISONE 20 MG PO TABS: 40.0000 mg | ORAL_TABLET | Freq: Every day | ORAL | 0 refills | Status: AC

## 2023-10-16 ENCOUNTER — Other Ambulatory Visit: Payer: Self-pay | Admitting: Orthopedic Surgery

## 2023-10-16 DIAGNOSIS — M25461 Effusion, right knee: Secondary | ICD-10-CM

## 2023-10-21 ENCOUNTER — Ambulatory Visit: Admitting: Family Medicine

## 2023-10-27 ENCOUNTER — Ambulatory Visit
Admission: RE | Admit: 2023-10-27 | Discharge: 2023-10-27 | Disposition: A | Discharge status: Home / Self Care | Source: Ambulatory Visit | Attending: Orthopedic Surgery | Admitting: Orthopedic Surgery

## 2023-10-27 DIAGNOSIS — M25561 Pain in right knee: Secondary | ICD-10-CM

## 2024-01-12 NOTE — Progress Notes (Deleted)
 New Patient Visit  Subjective:     Patient ID: Carol Giles, female    DOB: 1965/10/26, 58 y.o.   MRN: [id number]  No chief complaint on file.   HPI  Discussed the use of AI scribe software for clinical note transcription with the patient, who gave verbal consent to proceed.  History of Present Illness      ROS Per HPI  Outpatient Encounter Medications as of 01/14/2024  Medication Sig  . amLODipine  (NORVASC ) 5 MG tablet TAKE 1 TABLET(5 MG) BY MOUTH DAILY  . azithromycin  (ZITHROMAX ) 250 MG tablet TAKE AS DIRECTED IN PACKAGE  . bisacodyl  5 MG EC tablet Use as directed.  . clotrimazole -betamethasone  (LOTRISONE ) cream Apply 1 application topically 2 (two) times daily.  . fluconazole  (DIFLUCAN ) 150 MG tablet TAKE 1 TABLET(150 MG) BY MOUTH 1 TIME FOR 1 DOSE  . fluticasone  (FLONASE ) 50 MCG/ACT nasal spray Place 2 sprays into both nostrils daily.  SABRA loratadine (CLARITIN) 10 MG tablet Take 10 mg by mouth daily as needed. For seasonal allergies  . Phentermine  HCl (LOMAIRA ) 8 MG TABS Take 1 tablet (8 mg total) by mouth daily 30 minutes before meal.  . polyethylene glycol-electrolytes (NULYTELY) 420 g solution Use as directed.  . predniSONE  (DELTASONE ) 20 MG tablet Take 2 tablets (40 mg total) by mouth daily with breakfast.  . triamcinolone  cream (KENALOG ) 0.1 % Apply 1 Application topically 2 (two) times daily as needed for itchy rash.  . triamcinolone  ointment (KENALOG ) 0.1 % Apply topically 2 (two) times daily. to affected area   No facility-administered encounter medications on file as of 01/14/2024.    Past Medical History:  Diagnosis Date  . Allergy   . No pertinent past medical history   . Recurrent upper respiratory infection (URI)    abx completed    Past Surgical History:  Procedure Laterality Date  . ABDOMINAL HYSTERECTOMY    . BILATERAL SALPINGECTOMY  03/05/2012   Procedure: BILATERAL SALPINGECTOMY;  Surgeon: Olam Mill, MD;  Location: [facility];   Service: Gynecology;  Laterality: Bilateral;  . BLADDER SUSPENSION  03/03/2011   Procedure: TRANSVAGINAL TAPE (TVT) PROCEDURE;  Surgeon: Olam DELENA Mill, MD;  Location: [facility];  Service: Gynecology;  Laterality: Bilateral;  transobturator mid-urethral sling placement   . CYSTOSCOPY  03/03/2011   Procedure: CYSTOSCOPY;  Surgeon: Olam DELENA Mill, MD;  Location: [facility];  Service: Gynecology;;  . DILATION AND CURETTAGE OF UTERUS  02/28/05  . NOVASURE ABLATION  02/28/05  . ROBOTIC ASSISTED TOTAL HYSTERECTOMY  03/05/2012   Procedure: ROBOTIC ASSISTED TOTAL HYSTERECTOMY;  Surgeon: Olam Mill, MD;  Location: [facility];  Service: Gynecology;  Laterality: N/A;  . SVD     x 2  . TUBAL LIGATION  05/05/00  . WISDOM TOOTH EXTRACTION      Family History  Problem Relation Age of Onset  . Hypertension Mother   . Thyroid disease Mother   . Hypertension Father   . Diabetes Father   . Hypertension Brother   . Stroke Maternal Aunt   . Stroke Maternal Uncle   . Stroke Paternal Aunt   . Stroke Paternal Uncle   . Stroke Maternal Grandmother   . Cancer Maternal Grandfather        lung    Social History   Socioeconomic History  . Marital status: Married    Spouse name: Not on file  . Number of children: Not on file  . Years of education: Not on file  . Highest education level:  Not on file  Occupational History  . Not on file  Tobacco Use  . Smoking status: Never  . Smokeless tobacco: Never  Substance and Sexual Activity  . Alcohol use: No  . Drug use: No  . Sexual activity: Never    Birth control/protection: Surgical  Other Topics Concern  . Not on file  Social History Narrative  . Not on file   Social Drivers of Health   Financial Resource Strain: Not on file  Food Insecurity: Not on file  Transportation Needs: Not on file  Physical Activity: Not on file  Stress: Not on file  Social Connections: Not on file  Intimate Partner Violence: Not on file       Objective:     LMP 02/27/2012    Physical Exam Vitals and nursing note reviewed.  Constitutional:      General: She is not in acute distress.    Appearance: Normal appearance. She is normal weight.  HENT:     Head: Normocephalic and atraumatic.     Right Ear: External ear normal.     Left Ear: External ear normal.     Nose: Nose normal.     Mouth/Throat:     Mouth: Mucous membranes are moist.     Pharynx: Oropharynx is clear.  Eyes:     Extraocular Movements: Extraocular movements intact.     Pupils: Pupils are equal, round, and reactive to light.  Cardiovascular:     Rate and Rhythm: Normal rate and regular rhythm.     Pulses: Normal pulses.     Heart sounds: Normal heart sounds.  Pulmonary:     Effort: Pulmonary effort is normal. No respiratory distress.     Breath sounds: Normal breath sounds. No wheezing, rhonchi or rales.  Musculoskeletal:        General: Normal range of motion.     Cervical back: Normal range of motion.     Right lower leg: No edema.     Left lower leg: No edema.  Lymphadenopathy:     Cervical: No cervical adenopathy.  Neurological:     General: No focal deficit present.     Mental Status: She is alert and oriented to person, place, and time.  Psychiatric:        Mood and Affect: Mood normal.        Thought Content: Thought content normal.    No results found for any visits on 01/14/24.      Assessment & Plan:   Assessment and Plan Assessment & Plan      No orders of the defined types were placed in this encounter.    No orders of the defined types were placed in this encounter.   No follow-ups on file.  Carol LITTIE Ku, FNP

## 2024-01-14 ENCOUNTER — Ambulatory Visit: Admitting: Family Medicine
